# Patient Record
Sex: Female | Born: 1965 | Race: White | Hispanic: No | Marital: Married | Smoking: Never smoker
Health system: Southern US, Community
[De-identification: ages and names within clinical notes are randomized; demographics above are authoritative.]

## PROBLEM LIST (undated history)

## (undated) DIAGNOSIS — S2239XA Fracture of one rib, unspecified side, initial encounter for closed fracture: Secondary | ICD-10-CM

## (undated) DIAGNOSIS — Z803 Family history of malignant neoplasm of breast: Secondary | ICD-10-CM

## (undated) DIAGNOSIS — Z800 Family history of malignant neoplasm of digestive organs: Secondary | ICD-10-CM

## (undated) DIAGNOSIS — T7840XA Allergy, unspecified, initial encounter: Secondary | ICD-10-CM

## (undated) DIAGNOSIS — K219 Gastro-esophageal reflux disease without esophagitis: Secondary | ICD-10-CM

## (undated) DIAGNOSIS — N189 Chronic kidney disease, unspecified: Secondary | ICD-10-CM

## (undated) DIAGNOSIS — M8580 Other specified disorders of bone density and structure, unspecified site: Secondary | ICD-10-CM

## (undated) DIAGNOSIS — R112 Nausea with vomiting, unspecified: Secondary | ICD-10-CM

## (undated) DIAGNOSIS — Z923 Personal history of irradiation: Secondary | ICD-10-CM

## (undated) DIAGNOSIS — R000 Tachycardia, unspecified: Secondary | ICD-10-CM

## (undated) DIAGNOSIS — G709 Myoneural disorder, unspecified: Secondary | ICD-10-CM

## (undated) DIAGNOSIS — R51 Headache: Secondary | ICD-10-CM

## (undated) DIAGNOSIS — R0789 Other chest pain: Secondary | ICD-10-CM

## (undated) DIAGNOSIS — M1990 Unspecified osteoarthritis, unspecified site: Secondary | ICD-10-CM

## (undated) DIAGNOSIS — Z87442 Personal history of urinary calculi: Secondary | ICD-10-CM

## (undated) DIAGNOSIS — N62 Hypertrophy of breast: Secondary | ICD-10-CM

## (undated) DIAGNOSIS — Z9889 Other specified postprocedural states: Secondary | ICD-10-CM

## (undated) DIAGNOSIS — C50919 Malignant neoplasm of unspecified site of unspecified female breast: Secondary | ICD-10-CM

## (undated) DIAGNOSIS — R519 Headache, unspecified: Secondary | ICD-10-CM

## (undated) DIAGNOSIS — U071 COVID-19: Secondary | ICD-10-CM

## (undated) DIAGNOSIS — F40240 Claustrophobia: Secondary | ICD-10-CM

## (undated) DIAGNOSIS — C50812 Malignant neoplasm of overlapping sites of left female breast: Secondary | ICD-10-CM

## (undated) DIAGNOSIS — J40 Bronchitis, not specified as acute or chronic: Secondary | ICD-10-CM

## (undated) HISTORY — DX: Family history of malignant neoplasm of digestive organs: Z80.0

## (undated) HISTORY — DX: COVID-19: U07.1

## (undated) HISTORY — DX: Allergy, unspecified, initial encounter: T78.40XA

## (undated) HISTORY — DX: Malignant neoplasm of unspecified site of unspecified female breast: C50.919

## (undated) HISTORY — PX: UPPER GASTROINTESTINAL ENDOSCOPY: SHX188

## (undated) HISTORY — DX: Chronic kidney disease, unspecified: N18.9

## (undated) HISTORY — DX: Family history of malignant neoplasm of breast: Z80.3

## (undated) HISTORY — PX: POLYPECTOMY: SHX149

## (undated) HISTORY — DX: Malignant neoplasm of overlapping sites of left female breast: C50.812

## (undated) HISTORY — PX: REDUCTION MAMMAPLASTY: SUR839

## (undated) HISTORY — DX: Claustrophobia: F40.240

## (undated) HISTORY — DX: Myoneural disorder, unspecified: G70.9

## (undated) HISTORY — DX: Fracture of one rib, unspecified side, initial encounter for closed fracture: S22.39XA

## (undated) HISTORY — DX: Other chest pain: R07.89

## (undated) HISTORY — PX: WISDOM TOOTH EXTRACTION: SHX21

## (undated) HISTORY — DX: Other specified disorders of bone density and structure, unspecified site: M85.80

---

## 1996-11-24 HISTORY — PX: BACK SURGERY: SHX140

## 1996-11-24 HISTORY — PX: CHOLECYSTECTOMY: SHX55

## 2003-12-20 ENCOUNTER — Encounter: Admission: RE | Admit: 2003-12-20 | Discharge: 2004-03-19 | Payer: Self-pay | Admitting: Gastroenterology

## 2008-01-16 ENCOUNTER — Encounter: Admission: RE | Admit: 2008-01-16 | Discharge: 2008-01-16 | Payer: Self-pay | Admitting: Neurosurgery

## 2016-04-24 HISTORY — PX: LIPOMA EXCISION: SHX5283

## 2016-10-23 HISTORY — PX: COLONOSCOPY: SHX174

## 2016-11-24 HISTORY — PX: BREAST LUMPECTOMY: SHX2

## 2016-12-25 DIAGNOSIS — C50919 Malignant neoplasm of unspecified site of unspecified female breast: Secondary | ICD-10-CM | POA: Insufficient documentation

## 2016-12-25 HISTORY — DX: Malignant neoplasm of unspecified site of unspecified female breast: C50.919

## 2017-01-21 ENCOUNTER — Other Ambulatory Visit: Payer: PRIVATE HEALTH INSURANCE

## 2017-01-21 ENCOUNTER — Other Ambulatory Visit: Payer: Self-pay | Admitting: General Surgery

## 2017-01-21 ENCOUNTER — Ambulatory Visit (HOSPITAL_BASED_OUTPATIENT_CLINIC_OR_DEPARTMENT_OTHER): Payer: PRIVATE HEALTH INSURANCE | Admitting: Genetic Counselor

## 2017-01-21 DIAGNOSIS — Z800 Family history of malignant neoplasm of digestive organs: Secondary | ICD-10-CM | POA: Diagnosis not present

## 2017-01-21 DIAGNOSIS — Z803 Family history of malignant neoplasm of breast: Secondary | ICD-10-CM

## 2017-01-21 DIAGNOSIS — Z808 Family history of malignant neoplasm of other organs or systems: Secondary | ICD-10-CM

## 2017-01-21 DIAGNOSIS — Z806 Family history of leukemia: Secondary | ICD-10-CM

## 2017-01-21 DIAGNOSIS — Z315 Encounter for genetic counseling: Secondary | ICD-10-CM

## 2017-01-21 DIAGNOSIS — Z170 Estrogen receptor positive status [ER+]: Principal | ICD-10-CM

## 2017-01-21 DIAGNOSIS — D0512 Intraductal carcinoma in situ of left breast: Secondary | ICD-10-CM

## 2017-01-21 DIAGNOSIS — C50912 Malignant neoplasm of unspecified site of left female breast: Secondary | ICD-10-CM

## 2017-01-22 ENCOUNTER — Encounter: Payer: Self-pay | Admitting: Genetic Counselor

## 2017-01-22 DIAGNOSIS — Z803 Family history of malignant neoplasm of breast: Secondary | ICD-10-CM | POA: Insufficient documentation

## 2017-01-22 DIAGNOSIS — Z800 Family history of malignant neoplasm of digestive organs: Secondary | ICD-10-CM | POA: Insufficient documentation

## 2017-01-22 DIAGNOSIS — C50919 Malignant neoplasm of unspecified site of unspecified female breast: Secondary | ICD-10-CM | POA: Insufficient documentation

## 2017-01-22 NOTE — Progress Notes (Addendum)
REFERRING PROVIDER: Rolm Bookbinder, MD [place], [place] [address]  PRIMARY PROVIDER:  Myrlene Broker, MD  PRIMARY REASON FOR VISIT:  1. Malignant neoplasm of left breast in female, estrogen receptor positive, unspecified site of breast ([place])   2. Family history of breast cancer   3. Family history of colon cancer      HISTORY OF PRESENT ILLNESS:   [name], a 51 y.o. female, was seen for a [place] cancer genetics consultation at the request of Dr. Donne Hazel due to a personal and family history of cancer.  [name] presents to clinic today to discuss the possibility of a hereditary predisposition to cancer, genetic testing, and to further clarify her future cancer risks, as well as potential cancer risks for family members.   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 She was referred for genetic testing to help determine treatment decisions.     CANCER HISTORY:   No history exists.     HORMONAL RISK FACTORS:  Menarche was at age 63.  First live birth at age 9.  OCP use for approximately 13-14 years.  Ovaries intact: yes.  Hysterectomy: no.  Menopausal status: perimenopausal.  HRT use: 0 years. Colonoscopy: yes; 1-2 polyps. Mammogram within the last year: yes. Number of breast biopsies: 1. Up to date with pelvic exams:  yes. Any excessive radiation exposure in the past:  no  Past Medical History:  Diagnosis Date  . Breast cancer (HCC)    DCIS ER/PR+  . Family history of breast cancer   . Family history of colon cancer     History reviewed. No pertinent surgical history.  Social History   Social History  . Marital status: Married    Spouse name: N/A  . Number of children: N/A  . Years of education: N/A   Social History Main Topics  . Smoking status: Never Smoker  . Smokeless tobacco: Never Used  . Alcohol use No  . Drug use: No  . Sexual activity: Not  Asked   Other Topics Concern  . None   Social History Narrative  . None     FAMILY HISTORY:  We obtained a detailed, 4-generation family history.  Significant diagnoses are listed below: Family History  Problem Relation Age of Onset  . Breast cancer Mother 12  . Heart attack Father   . Lung cancer Maternal Aunt   . Congestive Heart Failure Maternal Uncle   . Skin cancer Paternal Aunt     possible melanoma  . Skin cancer Paternal Uncle     possible melanoma  . Colon cancer Maternal Grandmother     dx in her 86s  . Heart attack Maternal Grandfather   . Stroke Paternal Grandmother   . COPD Paternal Grandfather   . Colon cancer Brother 59    died at 10  . Leukemia Maternal Aunt   . Congestive Heart Failure Maternal Aunt   . Breast cancer Cousin     dx in he r30s  . Breast cancer Cousin     dx in her 88s-40s    The patient has a young son who is cancer free.  She had two brothers who are deceased.  One died at age 20 in a car accident and the other died at 20 from colon cancer.  Her father died of a heart attack at 52.  Her mother is alive at  40 and was diagnosed with breast cancer on the same day as the patient.  Her mother had four sisters and three brothers.  One sister died in early childhood, one died of lung cancer and one died of leukemia.  One brother died of heart disease and had a daughter who had breast cancer in her 75's.  Two brothers are alive, and one has a daughter who had breast cancer in her 30's-40's.  The patient's father had two brothers and two sisters.  One brother and one sister died of skin cancer, possibly melanoma.  There is no other reported family history of cancer on this side of the family.  Ms. Westendorf is unaware of previous family history of genetic testing for hereditary cancer risks. Patient's ancestors are of [place] descent. There is no reported Ashkenazi Jewish ancestry. There is no known consanguinity.  GENETIC COUNSELING ASSESSMENT:  Anne Lawrence is a 51 y.o. female with a personal and family history of breast cancer and family history of colon cancer which is somewhat suggestive of a hereditary cancer syndrome and predisposition to cancer. We, therefore, discussed and recommended the following at today's visit.   DISCUSSION: We discussed that about 5-10% of breast cancer is hereditary with most cases due to BRCA mutations.  Other genes associated with hereditary breast cancer syndromes that occur most commonly outside of BRCA, include ATM, CHEK2 and PALB2. We reviewed the characteristics, features and inheritance patterns of hereditary cancer syndromes. We also discussed genetic testing, including the appropriate family members to test, the process of testing, insurance coverage and turn-around-time for results. We discussed the implications of a negative, positive and/or variant of uncertain significant result. We recommended Ms. Hoes pursue genetic testing for the [facility] STAT breast cancer panel (9 genes).  The STAT 9  gene panel offered by [facility] and includes sequencing and rearrangement analysis for the following 9 genes: ATM, BRCA1, BRCA2, CHEK2, CDH1, PALB2, PTEN, STK11, and TP53.  If this is negative, we will reflex testing to the Common Hereditary Cancer gene panel and ensure that the melanoma genes are also part of this testing. The Hereditary Gene Panel offered by [facility] includes sequencing and/or deletion duplication testing of the following 43 genes: APC, ATM, AXIN2, BARD1, BMPR1A, BRCA1, BRCA2, BRIP1, CDH1, CDKN2A (p14ARF), CDKN2A (p16INK4a), CHEK2, DICER1, EPCAM (Deletion/duplication testing only), GREM1 (promoter region deletion/duplication testing only), KIT, MEN1, MLH1, MSH2, MSH6, MUTYH, NBN, NF1, PALB2, PDGFRA, PMS2, POLD1, POLE, PTEN, RAD50, RAD51C, RAD51D, SDHB, SDHC, SDHD, SMAD4, SMARCA4. STK11, TP53, TSC1, TSC2, and VHL.  The following gene was evaluated for sequence changes only: SDHA and HOXB13 c.251G>A  variant only. The Melanoma panel offered by [facility] includes sequencing and/or deletion duplication testing of the following 12 genes: BAP1, BRCA1, BRCA2, BRIP1, CDK4, CDKN2A (p14ARF), CDKN2A (p16INK4a), MC1R, POT1, PTEN, RB1, TERT, and TP53.  The following gene was evaluated for sequence changes only: MITF (c.952G>A, p.GLU318Lys variant only).   Based on Ms. Miers's personal and family history of cancer, she meets medical criteria for genetic testing. Despite that she meets criteria, she may still have an out of pocket cost. We discussed that if her out of pocket cost for testing is over $100, the laboratory will call and confirm whether she wants to proceed with testing.  If the out of pocket cost of testing is less than $100 she will be billed by the genetic testing laboratory.   PLAN: After considering the risks, benefits, and limitations, Ms. Vlcek  provided informed consent to pursue genetic testing  and the blood sample was sent to [facility] for analysis of the Common Hereditary Cancer Panel. Results should be available within approximately 2-3 weeks' time, at which point they will be disclosed by telephone to Ms. Widmann, as will any additional recommendations warranted by these results. Ms. Porcher will receive a summary of her genetic counseling visit and a copy of her results once available. This information will also be available in Epic. We encouraged Ms. Brar to remain in contact with cancer genetics annually so that we can continuously update the family history and inform her of any changes in cancer genetics and testing that may be of benefit for her family. Ms. Durley questions were answered to her satisfaction today. Our contact information was provided should additional questions or concerns arise.  Lastly, we encouraged Ms. Filsinger to remain in contact with cancer genetics annually so that we can continuously update the family history and inform her of any changes in cancer  genetics and testing that may be of benefit for this family.   Ms.  Mickle questions were answered to her satisfaction today. Our contact information was provided should additional questions or concerns arise. Thank you for the referral and allowing [place] to share in the care of your patient.   Rilya Longo P. Florene Glen, [employer], [facility] Certified Genetic Counselor [email or web address] phone: [phone]  The patient was seen for a total of 45 minutes in face-to-face genetic counseling.  This patient was discussed with Drs. Magrinat, Lindi Adie and/or Burr Medico who agrees with the above.    _______________________________________________________________________ For Office Staff:  Number of people involved in session: 2 Was an Intern/ student involved with case: no

## 2017-01-28 ENCOUNTER — Ambulatory Visit
Admission: RE | Admit: 2017-01-28 | Discharge: 2017-01-28 | Disposition: A | Discharge status: Home / Self Care | Payer: PRIVATE HEALTH INSURANCE | Source: Ambulatory Visit | Attending: General Surgery | Admitting: General Surgery

## 2017-01-28 DIAGNOSIS — E119 Type 2 diabetes mellitus without complications: Secondary | ICD-10-CM

## 2017-01-28 DIAGNOSIS — K589 Irritable bowel syndrome without diarrhea: Secondary | ICD-10-CM

## 2017-01-28 DIAGNOSIS — D0512 Intraductal carcinoma in situ of left breast: Secondary | ICD-10-CM

## 2017-01-28 HISTORY — DX: Type 2 diabetes mellitus without complications: E11.9

## 2017-01-28 HISTORY — DX: Irritable bowel syndrome, unspecified: K58.9

## 2017-01-28 HISTORY — DX: Intraductal carcinoma in situ of left breast: D05.12

## 2017-01-28 MED ORDER — GADOBENATE DIMEGLUMINE 529 MG/ML IV SOLN
19.0000 mL | Freq: Once | INTRAVENOUS | Status: AC | PRN
  Administered 2017-01-28: 19 mL via INTRAVENOUS

## 2017-01-30 ENCOUNTER — Other Ambulatory Visit: Payer: Self-pay | Admitting: General Surgery

## 2017-01-30 ENCOUNTER — Telehealth: Payer: Self-pay | Admitting: Genetic Counselor

## 2017-01-30 ENCOUNTER — Encounter: Payer: Self-pay | Admitting: Genetic Counselor

## 2017-01-30 DIAGNOSIS — R921 Mammographic calcification found on diagnostic imaging of breast: Secondary | ICD-10-CM

## 2017-01-30 DIAGNOSIS — Z1379 Encounter for other screening for genetic and chromosomal anomalies: Secondary | ICD-10-CM

## 2017-01-30 HISTORY — DX: Encounter for other screening for genetic and chromosomal anomalies: Z13.79

## 2017-01-30 NOTE — Telephone Encounter (Signed)
Revealed negative genetic testing.  Discussed that we do not know why she has breast cancer or why there is cancer in the family. It could be due to a different gene that we are not testing, or maybe our current technology may not be able to pick something up.  It will be important for her to keep in contact with genetics to keep up with whether additional testing may be needed. 

## 2017-02-03 ENCOUNTER — Ambulatory Visit: Payer: Self-pay | Admitting: Genetic Counselor

## 2017-02-03 DIAGNOSIS — Z1379 Encounter for other screening for genetic and chromosomal anomalies: Secondary | ICD-10-CM

## 2017-02-03 DIAGNOSIS — Z800 Family history of malignant neoplasm of digestive organs: Secondary | ICD-10-CM

## 2017-02-03 DIAGNOSIS — C50912 Malignant neoplasm of unspecified site of left female breast: Secondary | ICD-10-CM

## 2017-02-03 DIAGNOSIS — Z170 Estrogen receptor positive status [ER+]: Secondary | ICD-10-CM

## 2017-02-03 DIAGNOSIS — Z803 Family history of malignant neoplasm of breast: Secondary | ICD-10-CM

## 2017-02-03 NOTE — Progress Notes (Signed)
HPI: Anne Lawrence was previously seen in the [place] clinic due to a personal and family history of cancer and concerns regarding a hereditary predisposition to cancer. Please refer to our prior cancer genetics clinic note for more information regarding Anne Lawrence's medical, social and family histories, and our assessment and recommendations, at the time. Anne Lawrence recent genetic test results were disclosed to her, as were recommendations warranted by these results. These results and recommendations are discussed in more detail below.  CANCER HISTORY:    Breast cancer [facility])    Initial Diagnosis    Breast cancer ([place])     01/30/2017 Genetic Testing    Negative genetic testing on the STAT Breast cancer panel and the reflexed common cancer panel.  The Hereditary Gene Panel offered by [facility] includes sequencing and/or deletion duplication testing of the following 43 genes: APC, ATM, AXIN2, BARD1, BMPR1A, BRCA1, BRCA2, BRIP1, CDH1, CDKN2A (p14ARF), CDKN2A (p16INK4a), CHEK2, DICER1, EPCAM (Deletion/duplication testing only), GREM1 (promoter region deletion/duplication testing only), KIT, MEN1, MLH1, MSH2, MSH6, MUTYH, NBN, NF1, PALB2, PDGFRA, PMS2, POLD1, POLE, PTEN, RAD50, RAD51C, RAD51D, SDHB, SDHC, SDHD, SMAD4, SMARCA4. STK11, TP53, TSC1, TSC2, and VHL.  The following gene was evaluated for sequence changes only: SDHA and HOXB13 c.251G>A variant only.  The report date is January 30, 2017.        FAMILY HISTORY:  We obtained a detailed, 4-generation family history.  Significant diagnoses are listed below: Family History  Problem Relation Age of Onset  . Breast cancer Mother 39  . Heart attack Father   . Lung cancer Maternal Aunt   . Congestive Heart Failure Maternal Uncle   . Skin cancer Paternal Aunt     possible melanoma  . Skin cancer Paternal Uncle     possible melanoma  . Colon cancer Maternal Grandmother     dx in her 80s  . Heart attack Maternal Grandfather   .  Stroke Paternal Grandmother   . COPD Paternal Grandfather   . Colon cancer Brother 61    died at 87  . Leukemia Maternal Aunt   . Congestive Heart Failure Maternal Aunt   . Breast cancer Cousin     dx in he r30s  . Breast cancer Cousin     dx in her 94s-40s    The patient has a young son who is cancer free.  She had two brothers who are deceased.  One died at age 7 in a car accident and the other died at 51 from colon cancer.  Her father died of a heart attack at 2.  Her mother is alive at 58 and was diagnosed with breast cancer on the same day as the patient.  Her mother had four sisters and three brothers.  One sister died in early childhood, one died of lung cancer and one died of leukemia.  One brother died of heart disease and had a daughter who had breast cancer in her 86's.  Two brothers are alive, and one has a daughter who had breast cancer in her 30's-40's.  The patient's father had two brothers and two sisters.  One brother and one sister died of skin cancer, possibly melanoma.  There is no other reported family history of cancer on this side of the family.  Anne Lawrence is unaware of previous family history of genetic testing for hereditary cancer risks. Patient's ancestors are of [place] descent. There is no reported Ashkenazi Jewish ancestry. There is no known consanguinity.  GENETIC TEST  RESULTS: Genetic testing reported out on January 24, 2017 through the Common Hereditary cancer panel found no deleterious mutations.  The Hereditary Gene Panel offered by [facility] includes sequencing and/or deletion duplication testing of the following 43 genes: APC, ATM, AXIN2, BARD1, BMPR1A, BRCA1, BRCA2, BRIP1, CDH1, CDKN2A (p14ARF), CDKN2A (p16INK4a), CHEK2, DICER1, EPCAM (Deletion/duplication testing only), GREM1 (promoter region deletion/duplication testing only), KIT, MEN1, MLH1, MSH2, MSH6, MUTYH, NBN, NF1, PALB2, PDGFRA, PMS2, POLD1, POLE, PTEN, RAD50, RAD51C, RAD51D, SDHB, SDHC, SDHD,  SMAD4, SMARCA4. STK11, TP53, TSC1, TSC2, and VHL.  The following gene was evaluated for sequence changes only: SDHA and HOXB13 c.251G>A variant only. The test report has been scanned into EPIC and is located under the Molecular Pathology section of the Results Review tab.   We discussed with Anne Lawrence that since the current genetic testing is not perfect, it is possible there may be a gene mutation in one of these genes that current testing cannot detect, but that chance is small. We also discussed, that it is possible that another gene that has not yet been discovered, or that we have not yet tested, is responsible for the cancer diagnoses in the family, and it is, therefore, important to remain in touch with cancer genetics in the future so that we can continue to offer Anne Lawrence the most up to date genetic testing.   CANCER SCREENING RECOMMENDATIONS:  This result is reassuring and indicates that Anne Lawrence likely does not have an increased risk for a future cancer due to a mutation in one of these genes. This normal test also suggests that Anne Lawrence's cancer was most likely not due to an inherited predisposition associated with one of these genes.  Most cancers happen by chance and this negative test suggests that her cancer falls into this category.  We, therefore, recommended she continue to follow the cancer management and screening guidelines provided by her oncology and primary healthcare provider.   RECOMMENDATIONS FOR FAMILY MEMBERS: Women in this family might be at some increased risk of developing cancer, over the general population risk, simply due to the family history of cancer. We recommended women in this family have a yearly mammogram beginning at age 22, or 56 years younger than the earliest onset of cancer, an annual clinical breast exam, and perform monthly breast self-exams. Women in this family should also have a gynecological exam as recommended by their primary provider. All  family members should have a colonoscopy by age 36.  FOLLOW-UP: Lastly, we discussed with Anne Lawrence that cancer genetics is a rapidly advancing field and it is possible that new genetic tests will be appropriate for her and/or her family members in the future. We encouraged her to remain in contact with cancer genetics on an annual basis so we can update her personal and family histories and let her know of advances in cancer genetics that may benefit this family.   Our contact number was provided. Anne Lawrence questions were answered to her satisfaction, and she knows she is welcome to call us at anytime with additional questions or concerns.   Roma Kayser, MS, [facility] Certified Genetic Counselor [email or web address]

## 2017-02-06 ENCOUNTER — Ambulatory Visit
Admission: RE | Admit: 2017-02-06 | Discharge: 2017-02-06 | Disposition: A | Discharge status: Home / Self Care | Payer: PRIVATE HEALTH INSURANCE | Source: Ambulatory Visit | Attending: General Surgery | Admitting: General Surgery

## 2017-02-06 ENCOUNTER — Other Ambulatory Visit: Payer: Self-pay | Admitting: General Surgery

## 2017-02-06 DIAGNOSIS — R921 Mammographic calcification found on diagnostic imaging of breast: Secondary | ICD-10-CM

## 2017-02-13 ENCOUNTER — Other Ambulatory Visit: Payer: Self-pay | Admitting: General Surgery

## 2017-02-13 DIAGNOSIS — D0512 Intraductal carcinoma in situ of left breast: Secondary | ICD-10-CM

## 2017-02-19 ENCOUNTER — Other Ambulatory Visit: Payer: Self-pay | Admitting: General Surgery

## 2017-02-19 DIAGNOSIS — D0512 Intraductal carcinoma in situ of left breast: Secondary | ICD-10-CM

## 2017-03-05 ENCOUNTER — Encounter (HOSPITAL_COMMUNITY): Payer: Self-pay | Admitting: *Deleted

## 2017-03-05 ENCOUNTER — Encounter (HOSPITAL_COMMUNITY)
Admission: RE | Admit: 2017-03-05 | Discharge: 2017-03-05 | Disposition: A | Discharge status: Home / Self Care | Payer: PRIVATE HEALTH INSURANCE | Source: Ambulatory Visit | Attending: General Surgery | Admitting: General Surgery

## 2017-03-05 DIAGNOSIS — Z01812 Encounter for preprocedural laboratory examination: Secondary | ICD-10-CM | POA: Diagnosis not present

## 2017-03-05 DIAGNOSIS — C50912 Malignant neoplasm of unspecified site of left female breast: Secondary | ICD-10-CM | POA: Insufficient documentation

## 2017-03-05 HISTORY — DX: Other specified postprocedural states: Z98.890

## 2017-03-05 HISTORY — DX: Tachycardia, unspecified: R00.0

## 2017-03-05 HISTORY — DX: Nausea with vomiting, unspecified: R11.2

## 2017-03-05 HISTORY — DX: Unspecified osteoarthritis, unspecified site: M19.90

## 2017-03-05 HISTORY — DX: Headache, unspecified: R51.9

## 2017-03-05 HISTORY — DX: Headache: R51

## 2017-03-05 HISTORY — DX: Personal history of urinary calculi: Z87.442

## 2017-03-05 HISTORY — DX: Gastro-esophageal reflux disease without esophagitis: K21.9

## 2017-03-05 LAB — CBC
HCT: 38.9 % (ref 36.0–46.0)
HEMOGLOBIN: 13.1 g/dL (ref 12.0–15.0)
MCH: 27.8 pg (ref 26.0–34.0)
MCHC: 33.7 g/dL (ref 30.0–36.0)
MCV: 82.6 fL (ref 78.0–100.0)
PLATELETS: 206 10*3/uL (ref 150–400)
RBC: 4.71 MIL/uL (ref 3.87–5.11)
RDW: 13.1 % (ref 11.5–15.5)
WBC: 10.7 10*3/uL — ABNORMAL HIGH (ref 4.0–10.5)

## 2017-03-05 LAB — BASIC METABOLIC PANEL
ANION GAP: 8 (ref 5–15)
BUN: 13 mg/dL (ref 6–20)
CALCIUM: 8.9 mg/dL (ref 8.9–10.3)
CO2: 23 mmol/L (ref 22–32)
Chloride: 109 mmol/L (ref 101–111)
Creatinine, Ser: 0.75 mg/dL (ref 0.44–1.00)
GFR calc Af Amer: >60 mL/min (ref >60)
GLUCOSE: 130 mg/dL — AB (ref 65–99)
Potassium: 3.6 mmol/L (ref 3.5–5.1)
Sodium: 140 mmol/L (ref 135–145)

## 2017-03-05 LAB — HCG, SERUM, QUALITATIVE: PREG SERUM: NEGATIVE

## 2017-03-05 NOTE — Progress Notes (Signed)
PCP:Dr. Janace Litten white [facility] in [place]--requested ekg tracing, notes, holter monitor results.

## 2017-03-05 NOTE — Pre-Procedure Instructions (Addendum)
Lamari Youngers  03/05/2017      [address], [place], [name] [address] Phone: [phone] Fax: [phone]    Your procedure is scheduled on Thurs. April 19  Report to [facility] Admitting at 5:30 A.M.  Call this number if you have problems the morning of surgery:  [phone]   Remember:  Do not eat food or drink liquids after midnight on Wed. April 18   Take these medicines the morning of surgery with A SIP OF WATER : nexium if needed, eye drops if needed              1 week prior to surgery stop: advil, motrin, ibuprofen, aleve, BC Powders, Goody's, vitamins/herbal medicines               The day of surgery 3:30 A.M. Drink Boost Breeze    Do not wear jewelry, make-up or nail polish.  Do not wear lotions, powders, or perfumes, or deoderant.  Do not shave 48 hours prior to surgery.  Men may shave face and neck.  Do not bring valuables to the hospital.  [facility] is not responsible for any belongings or valuables.  Contacts, dentures or bridgework may not be worn into surgery.  Leave your suitcase in the car.  After surgery it may be brought to your room.  For patients admitted to the hospital, discharge time will be determined by your treatment team.  Patients discharged the day of surgery will not be allowed to drive home.   Name and phone number of your drive Special instructions:  [facility]- Preparing For Surgery  Before surgery, you can play an important role. Because skin is not sterile, your skin needs to be as free of germs as possible. You can reduce the number of germs on your skin by washing with CHG (chlorahexidine gluconate) Soap before surgery.  CHG is an antiseptic cleaner which kills germs and bonds with the skin to continue killing germs even after washing.  Please do not use if you have an allergy to CHG or antibacterial soaps. If your skin becomes reddened/irritated stop using  the CHG.  Do not shave (including legs and underarms) for at least 48 hours prior to first CHG shower. It is OK to shave your face.  Please follow these instructions carefully.   1. Shower the NIGHT BEFORE SURGERY and the MORNING OF SURGERY with CHG.   2. If you chose to wash your hair, wash your hair first as usual with your normal shampoo.  3. After you shampoo, rinse your hair and body thoroughly to remove the shampoo.  4. Use CHG as you would any other liquid soap. You can apply CHG directly to the skin and wash gently with a scrungie or a clean washcloth.   5. Apply the CHG Soap to your body ONLY FROM THE NECK DOWN.  Do not use on open wounds or open sores. Avoid contact with your eyes, ears, mouth and genitals (private parts). Wash genitals (private parts) with your normal soap.  6. Wash thoroughly, paying special attention to the area where your surgery will be performed.  7. Thoroughly rinse your body with warm water from the neck down.  8. DO NOT shower/wash with your normal soap after using and rinsing off the CHG Soap.  9. Pat yourself dry with a CLEAN TOWEL.   10. Wear CLEAN PAJAMAS   11. Place  CLEAN SHEETS on your bed the night of your first shower and DO NOT SLEEP WITH PETS.    Day of Surgery: Do not apply any deodorants/lotions. Please wear clean clothes to the hospital/surgery center.      Please read over the following fact sheets that you were given. Coughing and Deep Breathing and Surgical Site Infection Prevention

## 2017-03-10 ENCOUNTER — Ambulatory Visit
Admission: RE | Admit: 2017-03-10 | Discharge: 2017-03-10 | Disposition: A | Discharge status: Home / Self Care | Payer: PRIVATE HEALTH INSURANCE | Source: Ambulatory Visit | Attending: General Surgery | Admitting: General Surgery

## 2017-03-10 DIAGNOSIS — D0512 Intraductal carcinoma in situ of left breast: Secondary | ICD-10-CM

## 2017-03-11 NOTE — Anesthesia Preprocedure Evaluation (Addendum)
Anesthesia Evaluation  Patient identified by MRN, date of birth, ID band Patient awake    Reviewed: Allergy & Precautions, H&P , NPO status , Patient's Chart, lab work & pertinent test results, reviewed documented beta blocker date and time   History of Anesthesia Complications (+) PONV  Airway Mallampati: II  TM Distance: >3 FB Neck ROM: Full    Dental no notable dental hx. (+) Teeth Intact, Dental Advisory Given   Pulmonary neg pulmonary ROS,    Pulmonary exam normal breath sounds clear to auscultation       Cardiovascular Exercise Tolerance: Good negative cardio ROS   Rhythm:Regular Rate:Normal     Neuro/Psych  Headaches, negative psych ROS   GI/Hepatic Neg liver ROS, GERD  Medicated and Controlled,  Endo/Other  Morbid obesity  Renal/GU negative Renal ROS  negative genitourinary   Musculoskeletal  (+) Arthritis ,   Abdominal   Peds  Hematology negative hematology ROS (+)   Anesthesia Other Findings   Reproductive/Obstetrics negative OB ROS                            Anesthesia Physical Anesthesia Plan  ASA: III  Anesthesia Plan: General   Post-op Pain Management:  Regional for Post-op pain   Induction: Intravenous  Airway Management Planned: Oral ETT  Additional Equipment:   Intra-op Plan:   Post-operative Plan: Extubation in OR  Informed Consent: I have reviewed the patients History and Physical, chart, labs and discussed the procedure including the risks, benefits and alternatives for the proposed anesthesia with the patient or authorized representative who has indicated his/her understanding and acceptance.   Dental advisory given  Plan Discussed with: CRNA, Anesthesiologist and Surgeon  Anesthesia Plan Comments:        Anesthesia Quick Evaluation

## 2017-03-12 ENCOUNTER — Ambulatory Visit (HOSPITAL_COMMUNITY): Payer: PRIVATE HEALTH INSURANCE | Admitting: Anesthesiology

## 2017-03-12 ENCOUNTER — Encounter (HOSPITAL_COMMUNITY)
Admission: RE | Disposition: A | Discharge status: Home / Self Care | Payer: Self-pay | Source: Ambulatory Visit | Attending: General Surgery

## 2017-03-12 ENCOUNTER — Ambulatory Visit (HOSPITAL_COMMUNITY)
Admission: RE | Admit: 2017-03-12 | Discharge: 2017-03-12 | Disposition: A | Discharge status: Home / Self Care | Payer: PRIVATE HEALTH INSURANCE | Source: Ambulatory Visit | Attending: General Surgery | Admitting: General Surgery

## 2017-03-12 ENCOUNTER — Ambulatory Visit
Admission: RE | Admit: 2017-03-12 | Discharge: 2017-03-12 | Disposition: A | Discharge status: Home / Self Care | Payer: PRIVATE HEALTH INSURANCE | Source: Ambulatory Visit | Attending: General Surgery | Admitting: General Surgery

## 2017-03-12 ENCOUNTER — Encounter (HOSPITAL_COMMUNITY): Payer: Self-pay | Admitting: *Deleted

## 2017-03-12 ENCOUNTER — Other Ambulatory Visit: Payer: Self-pay

## 2017-03-12 ENCOUNTER — Ambulatory Visit (HOSPITAL_BASED_OUTPATIENT_CLINIC_OR_DEPARTMENT_OTHER)
Admission: RE | Admit: 2017-03-12 | Discharge: 2017-03-12 | Disposition: A | Discharge status: Home / Self Care | Payer: PRIVATE HEALTH INSURANCE | Source: Ambulatory Visit | Attending: General Surgery | Admitting: General Surgery

## 2017-03-12 DIAGNOSIS — K219 Gastro-esophageal reflux disease without esophagitis: Secondary | ICD-10-CM | POA: Diagnosis not present

## 2017-03-12 DIAGNOSIS — D0512 Intraductal carcinoma in situ of left breast: Secondary | ICD-10-CM

## 2017-03-12 DIAGNOSIS — K5090 Crohn's disease, unspecified, without complications: Secondary | ICD-10-CM | POA: Insufficient documentation

## 2017-03-12 DIAGNOSIS — Z6841 Body Mass Index (BMI) 40.0 and over, adult: Secondary | ICD-10-CM | POA: Insufficient documentation

## 2017-03-12 DIAGNOSIS — Z79899 Other long term (current) drug therapy: Secondary | ICD-10-CM | POA: Insufficient documentation

## 2017-03-12 DIAGNOSIS — Z803 Family history of malignant neoplasm of breast: Secondary | ICD-10-CM | POA: Insufficient documentation

## 2017-03-12 DIAGNOSIS — C50512 Malignant neoplasm of lower-outer quadrant of left female breast: Secondary | ICD-10-CM | POA: Diagnosis present

## 2017-03-12 HISTORY — PX: BREAST LUMPECTOMY WITH RADIOACTIVE SEED AND SENTINEL LYMPH NODE BIOPSY: SHX6550

## 2017-03-12 SURGERY — BREAST LUMPECTOMY WITH RADIOACTIVE SEED AND SENTINEL LYMPH NODE BIOPSY
Anesthesia: General | Site: Breast | Laterality: Left

## 2017-03-12 MED ORDER — FENTANYL CITRATE (PF) 100 MCG/2ML IJ SOLN
INTRAMUSCULAR | Status: DC | PRN
  Administered 2017-03-12: 100 ug via INTRAVENOUS
  Administered 2017-03-12: 50 ug via INTRAVENOUS

## 2017-03-12 MED ORDER — ROCURONIUM BROMIDE 100 MG/10ML IV SOLN
INTRAVENOUS | Status: DC | PRN
  Administered 2017-03-12: 50 mg via INTRAVENOUS

## 2017-03-12 MED ORDER — DEXAMETHASONE SODIUM PHOSPHATE 10 MG/ML IJ SOLN
INTRAMUSCULAR | Status: AC
  Filled 2017-03-12: qty 1

## 2017-03-12 MED ORDER — CELECOXIB 200 MG PO CAPS
200.0000 mg | ORAL_CAPSULE | ORAL | Status: AC
  Administered 2017-03-12: 200 mg via ORAL
  Filled 2017-03-12: qty 1

## 2017-03-12 MED ORDER — PHENYLEPHRINE HCL 10 MG/ML IJ SOLN
INTRAVENOUS | Status: DC | PRN
  Administered 2017-03-12: 40 ug/min via INTRAVENOUS

## 2017-03-12 MED ORDER — DEXAMETHASONE SODIUM PHOSPHATE 10 MG/ML IJ SOLN
INTRAMUSCULAR | Status: DC | PRN
  Administered 2017-03-12: 10 mg via INTRAVENOUS

## 2017-03-12 MED ORDER — LACTATED RINGERS IV SOLN
INTRAVENOUS | Status: DC | PRN
  Administered 2017-03-12: 07:00:00 via INTRAVENOUS

## 2017-03-12 MED ORDER — METHYLENE BLUE 0.5 % INJ SOLN
INTRAVENOUS | Status: AC
  Filled 2017-03-12: qty 10

## 2017-03-12 MED ORDER — CHLORHEXIDINE GLUCONATE CLOTH 2 % EX PADS: 6.0000 | MEDICATED_PAD | Freq: Once | CUTANEOUS | Status: DC

## 2017-03-12 MED ORDER — CEFAZOLIN SODIUM-DEXTROSE 2-4 GM/100ML-% IV SOLN
2.0000 g | INTRAVENOUS | Status: AC
  Administered 2017-03-12: 2 g via INTRAVENOUS
  Filled 2017-03-12: qty 100

## 2017-03-12 MED ORDER — BUPIVACAINE HCL (PF) 0.25 % IJ SOLN
INTRAMUSCULAR | Status: AC
  Filled 2017-03-12: qty 30

## 2017-03-12 MED ORDER — SUCCINYLCHOLINE CHLORIDE 200 MG/10ML IV SOSY
PREFILLED_SYRINGE | INTRAVENOUS | Status: AC
  Filled 2017-03-12: qty 10

## 2017-03-12 MED ORDER — SODIUM CHLORIDE 0.9 % IJ SOLN
INTRAMUSCULAR | Status: AC
  Filled 2017-03-12: qty 10

## 2017-03-12 MED ORDER — TECHNETIUM TC 99M SULFUR COLLOID FILTERED
1.0000 | Freq: Once | INTRAVENOUS | Status: AC | PRN
  Administered 2017-03-12: 1 via INTRADERMAL

## 2017-03-12 MED ORDER — ROCURONIUM BROMIDE 10 MG/ML (PF) SYRINGE
PREFILLED_SYRINGE | INTRAVENOUS | Status: AC
  Filled 2017-03-12: qty 10

## 2017-03-12 MED ORDER — EPHEDRINE 5 MG/ML INJ
INTRAVENOUS | Status: AC
  Filled 2017-03-12: qty 10

## 2017-03-12 MED ORDER — GABAPENTIN 300 MG PO CAPS
300.0000 mg | ORAL_CAPSULE | ORAL | Status: AC
  Administered 2017-03-12: 300 mg via ORAL
  Filled 2017-03-12: qty 1

## 2017-03-12 MED ORDER — MIDAZOLAM HCL 5 MG/5ML IJ SOLN
INTRAMUSCULAR | Status: DC | PRN
  Administered 2017-03-12: 2 mg via INTRAVENOUS

## 2017-03-12 MED ORDER — OXYCODONE-ACETAMINOPHEN 10-325 MG PO TABS: 1.0000 | ORAL_TABLET | Freq: Four times a day (QID) | ORAL | 0 refills | Status: AC | PRN

## 2017-03-12 MED ORDER — ONDANSETRON HCL 4 MG/2ML IJ SOLN
INTRAMUSCULAR | Status: DC | PRN
  Administered 2017-03-12: 4 mg via INTRAVENOUS

## 2017-03-12 MED ORDER — 0.9 % SODIUM CHLORIDE (POUR BTL) OPTIME
TOPICAL | Status: DC | PRN
  Administered 2017-03-12: 1000 mL

## 2017-03-12 MED ORDER — HYDROMORPHONE HCL 1 MG/ML IJ SOLN: 0.2500 mg | INTRAMUSCULAR | Status: DC | PRN

## 2017-03-12 MED ORDER — BUPIVACAINE-EPINEPHRINE (PF) 0.5% -1:200000 IJ SOLN
INTRAMUSCULAR | Status: DC | PRN
Start: 2017-03-12 — End: 2017-03-12
  Administered 2017-03-12: 30 mL

## 2017-03-12 MED ORDER — FENTANYL CITRATE (PF) 250 MCG/5ML IJ SOLN
INTRAMUSCULAR | Status: AC
  Filled 2017-03-12: qty 5

## 2017-03-12 MED ORDER — LIDOCAINE HCL (CARDIAC) 20 MG/ML IV SOLN
INTRAVENOUS | Status: DC | PRN
  Administered 2017-03-12: 60 mg via INTRAVENOUS

## 2017-03-12 MED ORDER — PROPOFOL 10 MG/ML IV BOLUS
INTRAVENOUS | Status: DC | PRN
  Administered 2017-03-12: 200 mg via INTRAVENOUS

## 2017-03-12 MED ORDER — MIDAZOLAM HCL 2 MG/2ML IJ SOLN
INTRAMUSCULAR | Status: AC
  Filled 2017-03-12: qty 2

## 2017-03-12 MED ORDER — ACETAMINOPHEN 500 MG PO TABS
1000.0000 mg | ORAL_TABLET | ORAL | Status: AC
  Administered 2017-03-12: 1000 mg via ORAL
  Filled 2017-03-12: qty 2

## 2017-03-12 MED ORDER — PROPOFOL 10 MG/ML IV BOLUS
INTRAVENOUS | Status: AC
  Filled 2017-03-12: qty 20

## 2017-03-12 MED ORDER — ONDANSETRON HCL 4 MG/2ML IJ SOLN
INTRAMUSCULAR | Status: AC
  Filled 2017-03-12: qty 2

## 2017-03-12 MED ORDER — SUGAMMADEX SODIUM 200 MG/2ML IV SOLN
INTRAVENOUS | Status: DC | PRN
  Administered 2017-03-12: 200 mg via INTRAVENOUS

## 2017-03-12 SURGICAL SUPPLY — 57 items
ADH SKN CLS APL DERMABOND .7 (GAUZE/BANDAGES/DRESSINGS) ×2
APPLIER CLIP 9.375 MED OPEN (MISCELLANEOUS) ×3
APR CLP MED 9.3 20 MLT OPN (MISCELLANEOUS) ×1
BINDER BREAST LRG (GAUZE/BANDAGES/DRESSINGS) IMPLANT
BINDER BREAST XLRG (GAUZE/BANDAGES/DRESSINGS) IMPLANT
BINDER BREAST XXLRG (GAUZE/BANDAGES/DRESSINGS) ×2 IMPLANT
BLADE SURG 15 STRL LF DISP TIS (BLADE) ×1 IMPLANT
BLADE SURG 15 STRL SS (BLADE) ×3
CANISTER SUCT 3000ML PPV (MISCELLANEOUS) ×3 IMPLANT
CHLORAPREP W/TINT 26ML (MISCELLANEOUS) ×3 IMPLANT
CLIP APPLIE 9.375 MED OPEN (MISCELLANEOUS) ×1 IMPLANT
CLOSURE WOUND 1/2 X4 (GAUZE/BANDAGES/DRESSINGS) ×1
COVER PROBE W GEL 5X96 (DRAPES) ×3 IMPLANT
COVER SURGICAL LIGHT HANDLE (MISCELLANEOUS) ×3 IMPLANT
DERMABOND ADVANCED (GAUZE/BANDAGES/DRESSINGS) ×4
DERMABOND ADVANCED .7 DNX12 (GAUZE/BANDAGES/DRESSINGS) ×1 IMPLANT
DEVICE DUBIN SPECIMEN MAMMOGRA (MISCELLANEOUS) ×3 IMPLANT
DRAPE CHEST BREAST 15X10 FENES (DRAPES) ×3 IMPLANT
DRAPE UTILITY XL STRL (DRAPES) ×3 IMPLANT
ELECT COATED BLADE 2.86 ST (ELECTRODE) ×3 IMPLANT
ELECT REM PT RETURN 9FT ADLT (ELECTROSURGICAL) ×3
ELECTRODE REM PT RTRN 9FT ADLT (ELECTROSURGICAL) ×1 IMPLANT
GLOVE BIO SURGEON STRL SZ7 (GLOVE) ×3 IMPLANT
GLOVE BIOGEL PI IND STRL 7.5 (GLOVE) ×1 IMPLANT
GLOVE BIOGEL PI INDICATOR 7.5 (GLOVE) ×2
GOWN STRL REUS W/ TWL LRG LVL3 (GOWN DISPOSABLE) ×2 IMPLANT
GOWN STRL REUS W/TWL LRG LVL3 (GOWN DISPOSABLE) ×6
ILLUMINATOR WAVEGUIDE N/F (MISCELLANEOUS) ×2 IMPLANT
KIT BASIN OR (CUSTOM PROCEDURE TRAY) ×3 IMPLANT
KIT MARKER MARGIN INK (KITS) ×3 IMPLANT
MARKER SKIN DUAL TIP RULER LAB (MISCELLANEOUS) ×3 IMPLANT
NDL FILTER BLUNT 18X1 1/2 (NEEDLE) IMPLANT
NDL HYPO 25X1 1.5 SAFETY (NEEDLE) ×1 IMPLANT
NDL SAFETY ECLIPSE 18X1.5 (NEEDLE) IMPLANT
NEEDLE FILTER BLUNT 18X 1/2SAF (NEEDLE)
NEEDLE FILTER BLUNT 18X1 1/2 (NEEDLE) IMPLANT
NEEDLE HYPO 18GX1.5 SHARP (NEEDLE)
NEEDLE HYPO 25X1 1.5 SAFETY (NEEDLE) ×3 IMPLANT
NS IRRIG 1000ML POUR BTL (IV SOLUTION) ×3 IMPLANT
PACK SURGICAL SETUP 50X90 (CUSTOM PROCEDURE TRAY) ×3 IMPLANT
PENCIL BUTTON HOLSTER BLD 10FT (ELECTRODE) ×3 IMPLANT
SPONGE LAP 18X18 X RAY DECT (DISPOSABLE) ×3 IMPLANT
STRIP CLOSURE SKIN 1/2X4 (GAUZE/BANDAGES/DRESSINGS) ×2 IMPLANT
SUT MNCRL AB 4-0 PS2 18 (SUTURE) ×6 IMPLANT
SUT SILK 2 0 SH (SUTURE) IMPLANT
SUT VIC AB 2-0 SH 27 (SUTURE) ×9
SUT VIC AB 2-0 SH 27X BRD (SUTURE) IMPLANT
SUT VIC AB 2-0 SH 27XBRD (SUTURE) ×2 IMPLANT
SUT VIC AB 3-0 SH 27 (SUTURE) ×6
SUT VIC AB 3-0 SH 27X BRD (SUTURE) ×2 IMPLANT
SYR BULB 3OZ (MISCELLANEOUS) ×3 IMPLANT
SYR CONTROL 10ML LL (SYRINGE) ×3 IMPLANT
TOWEL OR 17X24 6PK STRL BLUE (TOWEL DISPOSABLE) ×3 IMPLANT
TOWEL OR 17X26 10 PK STRL BLUE (TOWEL DISPOSABLE) ×1 IMPLANT
TUBE CONNECTING 12'X1/4 (SUCTIONS) ×1
TUBE CONNECTING 12X1/4 (SUCTIONS) ×2 IMPLANT
YANKAUER SUCT BULB TIP NO VENT (SUCTIONS) ×3 IMPLANT

## 2017-03-12 NOTE — Anesthesia Procedure Notes (Signed)
Anesthesia Regional Block: Pectoralis block   Pre-Anesthetic Checklist: ,, timeout performed, Correct Patient, Correct Site, Correct Laterality, Correct Procedure, Correct Position, site marked, Risks and benefits discussed, pre-op evaluation,  At surgeon's request and post-op pain management  Laterality: Left  Prep: Maximum Sterile Barrier Precautions used, chloraprep       Needles:  Injection technique: Single-shot  Needle Type: Echogenic Stimulator Needle     Needle Length: 9cm  Needle Gauge: 21     Additional Needles:   Procedures: ultrasound guided,,,,,,,,  Narrative:  Start time: 03/12/2017 7:12 AM End time: 03/12/2017 7:22 AM Injection made incrementally with aspirations every 5 mL. Anesthesiologist: Roderic Palau  Additional Notes: 2% Lidocaine skin wheel.

## 2017-03-12 NOTE — Progress Notes (Signed)
Patient ID: Anne Lawrence, female   DOB: Apr 11, 1966, 51 y.o.   MRN: [id number] I reviewed the [place] today and prescribed percocet for postop pain control

## 2017-03-12 NOTE — Anesthesia Procedure Notes (Signed)
Procedure Name: Intubation Date/Time: 03/12/2017 7:45 AM Performed by: Carney Living Pre-anesthesia Checklist: Patient identified, Emergency Drugs available, Suction available, Patient being monitored and Timeout performed Patient Re-evaluated:Patient Re-evaluated prior to inductionOxygen Delivery Method: Circle system utilized Preoxygenation: Pre-oxygenation with 100% oxygen Intubation Type: IV induction Ventilation: Mask ventilation without difficulty Laryngoscope Size: Mac and 4 Grade View: Grade II Tube type: Oral Tube size: 7.5 mm Number of attempts: 1 Airway Equipment and Method: Stylet Placement Confirmation: ETT inserted through vocal cords under direct vision,  positive ETCO2 and breath sounds checked- equal and bilateral Secured at: 21 cm Tube secured with: Tape Dental Injury: Teeth and Oropharynx as per pre-operative assessment

## 2017-03-12 NOTE — Progress Notes (Signed)
Report given to maria rn as caregiver 

## 2017-03-12 NOTE — Interval H&P Note (Signed)
History and Physical Interval Note:  03/12/2017 7:03 AM  Anne Lawrence  has presented today for surgery, with the diagnosis of LEFT BREAST DCIS  The various methods of treatment have been discussed with the patient and family. After consideration of risks, benefits and other options for treatment, the patient has consented to  Procedure(s): LEFT BREAST LUMPECTOMY WITH BRACKETED  RADIOACTIVE SEED AND SENTINEL LYMPH NODE BIOPSY (Left) as a surgical intervention .  The patient's history has been reviewed, patient examined, no change in status, stable for surgery.  I have reviewed the patient's chart and labs.  Questions were answered to the patient's satisfaction.     Kineta Fudala

## 2017-03-12 NOTE — Op Note (Signed)
Preoperative diagnosis: Leftbreast cancer, clinical stage I Postoperative diagnosis: same as above Procedure: 1. Left breast seed bracketed guided lumpectomy 2. Leftdeep axillary sentinel node biopsy Surgeon Dr Serita Grammes Anes general  EBL: minimal Comps none Specimen:  1. Leftbreast marked with paint containing seeds and clips 2. Leftaxillary sentinel nodes with highest count 4578 Sponge count correct at completion Dispoto recovery stable  Indications: This is a 61 yof with left breast cancer and 9 cm area of enhancement on mri. She has multiple biopsies. I asked for entire area to be bracketed that needed to be removed. This did not include anterior clip.  I had mm available in the operating room. We also discussed sn biopsy  Procedure: After informed consent was obtained the patient was taken to the OR. She was injected with technetium in the standard periareolar fashion.She was given anitibiotics. SCDs were in place. She was prepped and draped in the standard sterile surgical fashion. A timeout was performed. She had undergone a pectoral block.  She had been previously marked by Dr Iran Planas. The seeds were both located in the lower outer quadrant with the anterior seed being closer to the skin. I made an elliptical incision over this that included some skin.  I then removed the seedswith an attempt to get a clear margin. This waspassed off the table after marking with paint.I did confirm removal of theclipsand seeds with mammography.the clip and seeds weredirectly in center of specimen. this did not include the anterior clip and I could not locate this without prior marking.  I obtained hemostasis. I then placed clips in the cavity. I then closed this with 2-0 vicryl, 3-0 vicryl and 4-0 monocryl. Glue and steristrips were applied. I then made an axillary incision and carried this through the axillary fascia.  I identified the sentinel nodes with the highest count as  above. There was no gross nodal disease.There was no background radioactivity. I obtained hemostasis. I closed the axillary fascia and breast tissue with 2-0 vicryl. I then closed the dermis with 3-0 vicryl and the skin with 4-0 monocryl.  Glue and steristrips were placed A breast binder was placed. She was extubated and transferred to recovery stable

## 2017-03-12 NOTE — H&P (Signed)
51 yof referred by Dr Augustin Coupe for newly diagnosed left breast cancer. she has fh in her mom (who I am seeing today also) and a cousin. she has no prior history of breast cancer or breast issues. she underwent screening mm that has b density breasts. she has lateral left breast that measures 9x2.5x1.7 cm [place] is normal.she has undergone two biopsies of this area that is hg dcis with comedo and cribriform patterns, er and pr positive. she has no mass or dc. genetic testing is negative now. she also has mri that shows about 9 cm enhancement that correlates with calcifications on the mm. the nodes are negative and the right breast is negative. she is here with her husband to discuss options. she has seen Dr Iran Planas to discuss reduction  Past Surgical History  Breast Biopsy  Left. Cesarean Section - 1  Colon Polyp Removal - Colonoscopy  Gallbladder Surgery - Laparoscopic  Oral Surgery  Spinal Surgery - Lower Back   Diagnostic Studies History  Colonoscopy  within last year Mammogram  within last year Pap Smear  1-5 years ago  Allergies  Septra *ANTI-INFECTIVE AGENTS - MISC.*  Headache. severe headache (sulfa drugs)  Medication History  Metoprolol Succinate ER (25MG  Tablet ER 24HR, Oral at bedtime) Active. Vitamin D (Ergocalciferol) (50000UNIT Capsule, Oral) Active. Levocetirizine Dihydrochloride (5MG  Tablet, Oral as needed) Active. Esomeprazole Magnesium (40MG  Capsule DR, Oral as needed) Active. Vitamin D (Cholecalciferol) (Oral) Specific strength unknown - Active. Cholestyramine (4GM Packet, Oral) Active. depo provera (one shot every 3 months) Active. Medications Reconciled  Social History No alcohol use  No caffeine use  No drug use  Tobacco use  Never smoker.  Family History  Arthritis  Family Members In General. Breast Cancer  Family Members In General. Cancer  Family Members In General. Cerebrovascular Accident  Family Members In [place], Family Members In General. Diabetes Mellitus  Family Members In General. Heart Disease  Family Members In General, Father. Heart disease in female family member before age 62  Hypertension  Family Members In General. Ischemic Bowel Disease  Mother. Melanoma  Family Members In General. Migraine Headache  Son. Rectal Cancer  Family Members In General. Respiratory Condition  Family Members In General. Seizure disorder  Mother.  Pregnancy / Birth History Age at menarche  1 years. Contraceptive History  Depo-provera, Oral contraceptives. Gravida  1 Irregular periods  Length (months) of breastfeeding  3-6 Maternal age  51-40 Para  1  Other Problems  Arthritis  Back Pain  Cholelithiasis  Crohn's Disease  Gastroesophageal Reflux Disease  Kidney Stone  Other disease, cancer, significant illness    Review of Systems  General Not Present- Appetite Loss, Chills, Fatigue, Fever, Night Sweats, Weight Gain and Weight Loss. Skin Not Present- Change in Wart/Mole, Dryness, Hives, Jaundice, New Lesions, Non-Healing Wounds, Rash and Ulcer. HEENT Present- Wears glasses/contact lenses. Not Present- Earache, Hearing Loss, Hoarseness, Nose Bleed, Oral Ulcers, Ringing in the Ears, Seasonal Allergies, Sinus Pain, Sore Throat, Visual Disturbances and Yellow Eyes. Respiratory Present- Snoring. Not Present- Bloody sputum, Chronic Cough, Difficulty Breathing and Wheezing. Breast Present- Breast Mass. Not Present- Breast Pain, Nipple Discharge and Skin Changes. Cardiovascular Not Present- Chest Pain, Difficulty Breathing Lying Down, Leg Cramps, Palpitations, Rapid Heart Rate, Shortness of Breath and Swelling of Extremities. Gastrointestinal Not Present- Abdominal Pain, Bloating, Bloody Stool, Change in Bowel Habits, Chronic diarrhea, Constipation, Difficulty Swallowing, Excessive gas, Gets full quickly at meals, Hemorrhoids, Indigestion, Nausea, Rectal Pain and  Vomiting. Female  Genitourinary Not Present- Frequency, Nocturia, Painful Urination, Pelvic Pain and Urgency. Musculoskeletal Not Present- Back Pain, Joint Pain, Joint Stiffness, Muscle Pain, Muscle Weakness and Swelling of Extremities. Neurological Not Present- Decreased Memory, Fainting, Headaches, Numbness, Seizures, Tingling, Tremor, Trouble walking and Weakness. Psychiatric Not Present- Anxiety, Bipolar, Change in Sleep Pattern, Depression, Fearful and Frequent crying. Endocrine Not Present- Cold Intolerance, Excessive Hunger, Hair Changes, Heat Intolerance, Hot flashes and New Diabetes. Hematology Not Present- Blood Thinners, Easy Bruising, Excessive bleeding, Gland problems, HIV and Persistent Infections.  Vitals Weight: 255.2 lb Height: 62in Body Surface Area: 2.12 m Body Mass Index: 46.68 kg/m  Temp.: 98.67F  Pulse: 68 (Regular)  BP: 134/80 (Sitting, Left Arm, Standard) Physical Exam  General Mental Status-Alert. Orientation-Oriented X3. Chest and Lung Exam Chest and lung exam reveals -on auscultation, normal breath sounds, no adventitious sounds and normal vocal resonance. Breast Nipples-No Discharge. Breast Lump-No Palpable Breast Mass. Note: large pendulous breasts Cardiovascular Cardiovascular examination reveals -normal heart sounds, regular rate and rhythm with no murmurs. Lymphatic Head & Neck General Head & Neck Lymphatics: Bilateral - Description - Normal. Axillary General Axillary Region: Bilateral - Description - Normal. Note: no [place] adenopathy   Assessment & Plan Rolm Bookbinder MD; 02/13/2017 10:19 AM) BREAST NEOPLASM, TIS (DCIS), LEFT (D05.12) Story: left breast seed bracketed lumpectomy, left axillary sn biopsy followed by reduction discussed lumpectomy with seed bracketing. discussed 5-10% positive margin risk necessitating a second surgery. We discussed a sentinel lymph node biopsy as she does not appear to having lymph node  involvement right now. We discussed the performance of that with injection of radioactive tracer and possibly blue dye. We discussed that she would have an incision underneath her axillary hairline or would be done through the same incision. We discussed that there is a chance of having a positive node with a sentinel lymph node biopsy and we will await the permanent pathology to make any other first further decisions in terms of her treatment. One of these options might be to return to the operating room to perform an axillary lymph node dissection. We discussed up to a 5% risk lifetime of chronic shoulder pain as well as lymphedema associated with a sentinel lymph node biopsy. will schedule reduction with Dr Iran Planas following lumpectomy

## 2017-03-12 NOTE — Anesthesia Postprocedure Evaluation (Signed)
Anesthesia Post Note  Patient: Anne Lawrence  Procedure(s) Performed: Procedure(s) (LRB): LEFT BREAST LUMPECTOMY WITH BRACKETED  RADIOACTIVE SEED AND SENTINEL LYMPH NODE BIOPSY (Left)  Patient location during evaluation: PACU Anesthesia Type: General and Regional Level of consciousness: awake and alert Pain management: pain level controlled Vital Signs Assessment: post-procedure vital signs reviewed and stable Respiratory status: spontaneous breathing, nonlabored ventilation and respiratory function stable Cardiovascular status: blood pressure returned to baseline and stable Postop Assessment: no signs of nausea or vomiting Anesthetic complications: no       Last Vitals:  Vitals:   03/12/17 1000 03/12/17 1007  BP:  122/74  Pulse: 74 66  Resp: 20 20  Temp: 36.4 C     Last Pain:  Vitals:   03/12/17 1007  PainSc: (P) 0-No pain                 Locke Barrell,W. EDMOND

## 2017-03-12 NOTE — Brief Op Note (Signed)
03/12/2017  8:59 AM  PATIENT:  Anne Lawrence  51 y.o. female  PRE-OPERATIVE DIAGNOSIS:  LEFT BREAST DCIS  POST-OPERATIVE DIAGNOSIS:  LEFT BREAST DCIS  PROCEDURE:  Procedure(s): LEFT BREAST LUMPECTOMY WITH BRACKETED  RADIOACTIVE SEED AND SENTINEL LYMPH NODE BIOPSY (Left)  SURGEON:  Surgeon(s) and Role:    * Rolm Bookbinder, MD - Primary  PHYSICIAN ASSISTANT:   ASSISTANTS: none   ANESTHESIA:   general  EBL:  No intake/output data recorded.  BLOOD ADMINISTERED:none  DRAINS: none   LOCAL MEDICATIONS USED:  OTHER pectoral block  SPECIMEN:  Lumpectomy  DISPOSITION OF SPECIMEN:  PATHOLOGY  COUNTS:  YES  TOURNIQUET:  * No tourniquets in log *  DICTATION: .Dragon Dictation  PLAN OF CARE: Discharge to home after PACU  PATIENT DISPOSITION:  PACU - hemodynamically stable.   Delay start of Pharmacological VTE agent (>24hrs) due to surgical blood loss or risk of bleeding: not applicable

## 2017-03-12 NOTE — Transfer of Care (Signed)
Immediate Anesthesia Transfer of Care Note  Patient: Anne Lawrence  Procedure(s) Performed: Procedure(s): LEFT BREAST LUMPECTOMY WITH BRACKETED  RADIOACTIVE SEED AND SENTINEL LYMPH NODE BIOPSY (Left)  Patient Location: PACU  Anesthesia Type:General  Level of Consciousness: awake, alert , oriented and patient cooperative  Airway & Oxygen Therapy: Patient Spontanous Breathing and Patient connected to nasal cannula oxygen  Post-op Assessment: Report given to RN, Post -op Vital signs reviewed and stable and Patient moving all extremities X 4  Post vital signs: Reviewed and stable  Last Vitals:  Vitals:   03/12/17 0606 03/12/17 0912  BP: 122/72   Pulse: 63   Resp: 20   Temp: 37 C (P) 36.5 C    Last Pain:  Vitals:   03/12/17 0912  PainSc: (P) 0-No pain      Patients Stated Pain Goal: 3 (20/23/34 3568)  Complications: No apparent anesthesia complications

## 2017-03-13 ENCOUNTER — Encounter (HOSPITAL_COMMUNITY): Payer: Self-pay | Admitting: General Surgery

## 2017-03-16 NOTE — H&P (Signed)
Subjective:     Patient ID: Anne Lawrence is a 51 y.o. female.  HPI  Here for follow up discussion prior to planned oncoplastic breast reconstruction. Presented following screening MMG with distortion left breast. Diagnostic MMG with distortion with associated suspicious calcifications in the lower slight lateral left breast measuring 9 x 2.5 x 1.7 cm. US showed no definite discrete lesions that correlate to the mammographic calcifications. Biopsy of two areas with DCIS, ER/PR+. MRI with clumped and linear enhancement spanning up to 9 cm consistent with the 9 cm span of calcifications seen on MMG.  She is now post operative from left lumpectomy with final pathology 1.8 cm IDC, 5.2 cm DCIS, margins clear (focally 0.2 cm from lateral margin). 0/3 SLN  Mother with breast ca diagnosed same time as patient. Brother with colon ca. Genetics negative on STAT breast cancer panel.  PMH significant for borderline DM. Notes elevated HbA1c 6.7 in past and discussed metformin; patient changed diet and notes this improved.  Current 50DD. Notes several year history neck and back pain and rashes beneath breasts. Latter worse in warm months, requires prescription medication to control at least 3 times per year. Has been specialty fitted for bra, has tried NSAIDS and Tylenol, has completed PT following back surgery with minimal change. Denies numbness of hands.  Lumpectomy no weight.  Wt down 18  Lbs since beginning of year through diet. Works part time as Solicitor. Has 71 yo son and husband at home.  Review of Systems      Objective:   Physical Exam  Cardiovascular: Normal rate, regular rhythm and normal heart sounds.   Pulmonary/Chest: Effort normal and breath sounds normal.  Genitourinary: No breast discharge.  Lymphadenopathy:    She has no axillary adenopathy.   No palpable masses Grade 3 ptosis bilateral SN to nipple R40 L 40 cm BW R 24 L 24 cm Nipple to IMF R 16 L 17 cm     Assessment:     IDC, DCIS left s/p lumpectomy, SLN Family history breast ca Macromastia Chronic neck and back pain Morbid obesity    Plan:     Plan oncoplastic reconstruction as staged procedure with reduction 7-10 d post lumpectomy to ensure pathologic clearance.Reviewed reduction with anchor type scars, drains, post operative visits and limitations, recovery. Diminished sensation nipple and breast skin, risk of nipple loss, wound healing problems, asymmetry. Reviewed if nipple dies, would require additional surgery to resect. Discussedsmaller breast size may aid with adjuvant radiation. Discussed will have some contraction of breast volume and increased firmness with radiation, less ptosis with aging. Discussed changes with wt gain, loss, aging. If she pursues partial mastectomy, highly encourage her to pursue breast reduction prior to XRT as risk of complications post would be very high. Counseled I cannot assure her bra size. Discussed specifically with her SN to nipple distance, she would be considered high risk for nipple loss.   Additional risks including but not limited to bleeding, seroma, hematoma, fat necrosis, unacceptable cosmetic appearance, DVT/PE, damage to deeper structures, cardiopulmonary complications discussed.   Plan OP surgery.   Irene Limbo, MD [facility] [phone], pin [phone]

## 2017-03-18 ENCOUNTER — Encounter (HOSPITAL_COMMUNITY): Payer: Self-pay | Admitting: *Deleted

## 2017-03-18 NOTE — Progress Notes (Signed)
Pt denies SOB, chest pain, and being under the care of a cardiologist. Pt denies having a stress test, echo and cardiac cath. Pt denies having a chest x ray within the last year. Pt made aware to stop taking Stop taking Aspirin, vitamins, Advil Sinus PSE, and herbal medications. Do not take any NSAIDs ie: Ibuprofen, Advil, Naproxen, BC and Goody Powder or any medication containing Aspirin. Pt verbalized understanding of all pre-op instructions.

## 2017-03-19 ENCOUNTER — Telehealth: Payer: Self-pay | Admitting: *Deleted

## 2017-03-19 ENCOUNTER — Encounter (HOSPITAL_COMMUNITY): Payer: Self-pay

## 2017-03-19 ENCOUNTER — Encounter: Payer: Self-pay | Admitting: *Deleted

## 2017-03-19 DIAGNOSIS — Z170 Estrogen receptor positive status [ER+]: Secondary | ICD-10-CM | POA: Insufficient documentation

## 2017-03-19 DIAGNOSIS — C50312 Malignant neoplasm of lower-inner quadrant of left female breast: Secondary | ICD-10-CM | POA: Insufficient documentation

## 2017-03-19 DIAGNOSIS — C50812 Malignant neoplasm of overlapping sites of left female breast: Secondary | ICD-10-CM | POA: Insufficient documentation

## 2017-03-19 HISTORY — DX: Estrogen receptor positive status (ER+): Z17.0

## 2017-03-19 HISTORY — DX: Estrogen receptor positive status (ER+): C50.812

## 2017-03-19 NOTE — Telephone Encounter (Signed)
Received order per Dr. Donne Hazel for oncotype testing. Requisition sent to pathology.

## 2017-03-19 NOTE — Anesthesia Preprocedure Evaluation (Addendum)
Anesthesia Evaluation  Patient identified by MRN, date of birth, ID band Patient awake    Reviewed: Allergy & Precautions, H&P , NPO status , Patient's Chart, lab work & pertinent test results  History of Anesthesia Complications (+) PONV  Airway Mallampati: II  TM Distance: >3 FB Neck ROM: Full    Dental no notable dental hx. (+) Teeth Intact, Dental Advisory Given   Pulmonary neg pulmonary ROS,    Pulmonary exam normal breath sounds clear to auscultation       Cardiovascular Exercise Tolerance: Good negative cardio ROS   Rhythm:Regular Rate:Normal     Neuro/Psych  Headaches, negative psych ROS   GI/Hepatic Neg liver ROS, GERD  Medicated and Controlled,  Endo/Other  Morbid obesity  Renal/GU negative Renal ROS  negative genitourinary   Musculoskeletal  (+) Arthritis , Osteoarthritis,    Abdominal   Peds  Hematology negative hematology ROS (+)   Anesthesia Other Findings   Reproductive/Obstetrics negative OB ROS                           Anesthesia Physical Anesthesia Plan  ASA: III  Anesthesia Plan: General   Post-op Pain Management:    Induction: Intravenous  Airway Management Planned: Oral ETT  Additional Equipment:   Intra-op Plan:   Post-operative Plan: Extubation in OR  Informed Consent: I have reviewed the patients History and Physical, chart, labs and discussed the procedure including the risks, benefits and alternatives for the proposed anesthesia with the patient or authorized representative who has indicated his/her understanding and acceptance.   Dental advisory given  Plan Discussed with: CRNA  Anesthesia Plan Comments:         Anesthesia Quick Evaluation

## 2017-03-20 ENCOUNTER — Encounter (HOSPITAL_COMMUNITY): Payer: Self-pay | Admitting: *Deleted

## 2017-03-20 ENCOUNTER — Ambulatory Visit (HOSPITAL_COMMUNITY)
Admission: RE | Admit: 2017-03-20 | Discharge: 2017-03-20 | Disposition: A | Discharge status: Home / Self Care | Payer: PRIVATE HEALTH INSURANCE | Source: Ambulatory Visit | Attending: Plastic Surgery | Admitting: Plastic Surgery

## 2017-03-20 ENCOUNTER — Ambulatory Visit (HOSPITAL_COMMUNITY): Payer: PRIVATE HEALTH INSURANCE | Admitting: Anesthesiology

## 2017-03-20 ENCOUNTER — Encounter (HOSPITAL_COMMUNITY)
Admission: RE | Disposition: A | Discharge status: Home / Self Care | Payer: Self-pay | Source: Ambulatory Visit | Attending: Plastic Surgery

## 2017-03-20 DIAGNOSIS — Z800 Family history of malignant neoplasm of digestive organs: Secondary | ICD-10-CM | POA: Insufficient documentation

## 2017-03-20 DIAGNOSIS — Z803 Family history of malignant neoplasm of breast: Secondary | ICD-10-CM | POA: Diagnosis not present

## 2017-03-20 DIAGNOSIS — R7303 Prediabetes: Secondary | ICD-10-CM | POA: Insufficient documentation

## 2017-03-20 DIAGNOSIS — Z6841 Body Mass Index (BMI) 40.0 and over, adult: Secondary | ICD-10-CM | POA: Diagnosis not present

## 2017-03-20 DIAGNOSIS — Z79899 Other long term (current) drug therapy: Secondary | ICD-10-CM | POA: Insufficient documentation

## 2017-03-20 DIAGNOSIS — K219 Gastro-esophageal reflux disease without esophagitis: Secondary | ICD-10-CM | POA: Diagnosis not present

## 2017-03-20 DIAGNOSIS — Z9012 Acquired absence of left breast and nipple: Secondary | ICD-10-CM | POA: Diagnosis not present

## 2017-03-20 DIAGNOSIS — N62 Hypertrophy of breast: Secondary | ICD-10-CM | POA: Insufficient documentation

## 2017-03-20 DIAGNOSIS — Z853 Personal history of malignant neoplasm of breast: Secondary | ICD-10-CM | POA: Insufficient documentation

## 2017-03-20 HISTORY — DX: Hypertrophy of breast: N62

## 2017-03-20 HISTORY — DX: Bronchitis, not specified as acute or chronic: J40

## 2017-03-20 HISTORY — PX: BREAST REDUCTION SURGERY: SHX8

## 2017-03-20 LAB — BASIC METABOLIC PANEL
Anion gap: 10 (ref 5–15)
BUN: 12 mg/dL (ref 6–20)
CALCIUM: 8.7 mg/dL — AB (ref 8.9–10.3)
CHLORIDE: 107 mmol/L (ref 101–111)
CO2: 22 mmol/L (ref 22–32)
CREATININE: 0.62 mg/dL (ref 0.44–1.00)
GFR calc non Af Amer: >60 mL/min (ref >60)
Glucose, Bld: 117 mg/dL — ABNORMAL HIGH (ref 65–99)
Potassium: 3.7 mmol/L (ref 3.5–5.1)
Sodium: 139 mmol/L (ref 135–145)

## 2017-03-20 LAB — CBC
HEMATOCRIT: 38.2 % (ref 36.0–46.0)
HEMOGLOBIN: 12.4 g/dL (ref 12.0–15.0)
MCH: 27 pg (ref 26.0–34.0)
MCHC: 32.5 g/dL (ref 30.0–36.0)
MCV: 83 fL (ref 78.0–100.0)
Platelets: 250 10*3/uL (ref 150–400)
RBC: 4.6 MIL/uL (ref 3.87–5.11)
RDW: 13.2 % (ref 11.5–15.5)
WBC: 10.8 10*3/uL — ABNORMAL HIGH (ref 4.0–10.5)

## 2017-03-20 SURGERY — MAMMOPLASTY, REDUCTION
Anesthesia: General | Site: Breast | Laterality: Bilateral

## 2017-03-20 MED ORDER — FENTANYL CITRATE (PF) 250 MCG/5ML IJ SOLN
INTRAMUSCULAR | Status: AC
  Filled 2017-03-20: qty 5

## 2017-03-20 MED ORDER — HYDROMORPHONE HCL 1 MG/ML IJ SOLN
INTRAMUSCULAR | Status: AC
  Filled 2017-03-20: qty 1

## 2017-03-20 MED ORDER — PROPOFOL 10 MG/ML IV BOLUS
INTRAVENOUS | Status: DC | PRN
  Administered 2017-03-20: 130 mg via INTRAVENOUS

## 2017-03-20 MED ORDER — SUGAMMADEX SODIUM 200 MG/2ML IV SOLN
INTRAVENOUS | Status: DC | PRN
  Administered 2017-03-20: 250 mg via INTRAVENOUS

## 2017-03-20 MED ORDER — SUGAMMADEX SODIUM 500 MG/5ML IV SOLN
INTRAVENOUS | Status: AC
  Filled 2017-03-20: qty 5

## 2017-03-20 MED ORDER — CEFAZOLIN SODIUM-DEXTROSE 2-4 GM/100ML-% IV SOLN
2.0000 g | INTRAVENOUS | Status: AC
  Administered 2017-03-20: 2 g via INTRAVENOUS
  Filled 2017-03-20: qty 100

## 2017-03-20 MED ORDER — LIDOCAINE 2% (20 MG/ML) 5 ML SYRINGE
INTRAMUSCULAR | Status: AC
  Filled 2017-03-20: qty 5

## 2017-03-20 MED ORDER — HYDROMORPHONE HCL 1 MG/ML IJ SOLN
INTRAMUSCULAR | Status: AC
  Filled 2017-03-20: qty 0.5

## 2017-03-20 MED ORDER — PROPOFOL 10 MG/ML IV BOLUS
INTRAVENOUS | Status: AC
  Filled 2017-03-20: qty 20

## 2017-03-20 MED ORDER — CHLORHEXIDINE GLUCONATE CLOTH 2 % EX PADS: 6.0000 | MEDICATED_PAD | Freq: Once | CUTANEOUS | Status: DC

## 2017-03-20 MED ORDER — DEXTROSE 5 % IV SOLN
INTRAVENOUS | Status: DC | PRN
  Administered 2017-03-20: 20 ug/min via INTRAVENOUS

## 2017-03-20 MED ORDER — DEXAMETHASONE SODIUM PHOSPHATE 10 MG/ML IJ SOLN
INTRAMUSCULAR | Status: AC
  Filled 2017-03-20: qty 1

## 2017-03-20 MED ORDER — MIDAZOLAM HCL 2 MG/2ML IJ SOLN
INTRAMUSCULAR | Status: AC
  Filled 2017-03-20: qty 2

## 2017-03-20 MED ORDER — ONDANSETRON HCL 4 MG/2ML IJ SOLN
INTRAMUSCULAR | Status: AC
  Filled 2017-03-20: qty 2

## 2017-03-20 MED ORDER — ROCURONIUM BROMIDE 10 MG/ML (PF) SYRINGE
PREFILLED_SYRINGE | INTRAVENOUS | Status: DC | PRN
  Administered 2017-03-20: 10 mg via INTRAVENOUS
  Administered 2017-03-20: 50 mg via INTRAVENOUS
  Administered 2017-03-20 (×2): 20 mg via INTRAVENOUS

## 2017-03-20 MED ORDER — ACETAMINOPHEN 500 MG PO TABS
1000.0000 mg | ORAL_TABLET | ORAL | Status: AC
  Administered 2017-03-20: 1000 mg via ORAL
  Filled 2017-03-20: qty 2

## 2017-03-20 MED ORDER — ONDANSETRON HCL 4 MG/2ML IJ SOLN
INTRAMUSCULAR | Status: DC | PRN
  Administered 2017-03-20: 4 mg via INTRAVENOUS

## 2017-03-20 MED ORDER — 0.9 % SODIUM CHLORIDE (POUR BTL) OPTIME
TOPICAL | Status: DC | PRN
  Administered 2017-03-20: 1000 mL

## 2017-03-20 MED ORDER — HEPARIN SODIUM (PORCINE) 5000 UNIT/ML IJ SOLN
5000.0000 [IU] | INTRAMUSCULAR | Status: AC
  Administered 2017-03-20: 5000 [IU] via SUBCUTANEOUS
  Filled 2017-03-20: qty 1

## 2017-03-20 MED ORDER — MIDAZOLAM HCL 5 MG/5ML IJ SOLN
INTRAMUSCULAR | Status: DC | PRN
  Administered 2017-03-20: 2 mg via INTRAVENOUS

## 2017-03-20 MED ORDER — LACTATED RINGERS IV SOLN
INTRAVENOUS | Status: DC | PRN
  Administered 2017-03-20 (×3): via INTRAVENOUS

## 2017-03-20 MED ORDER — DEXAMETHASONE SODIUM PHOSPHATE 10 MG/ML IJ SOLN
INTRAMUSCULAR | Status: DC | PRN
  Administered 2017-03-20: 10 mg via INTRAVENOUS

## 2017-03-20 MED ORDER — BUPIVACAINE LIPOSOME 1.3 % IJ SUSP
20.0000 mL | INTRAMUSCULAR | Status: AC
  Administered 2017-03-20: 20 mL
  Filled 2017-03-20: qty 20

## 2017-03-20 MED ORDER — GABAPENTIN 300 MG PO CAPS
300.0000 mg | ORAL_CAPSULE | ORAL | Status: AC
  Administered 2017-03-20: 300 mg via ORAL
  Filled 2017-03-20: qty 1

## 2017-03-20 MED ORDER — LIDOCAINE 2% (20 MG/ML) 5 ML SYRINGE
INTRAMUSCULAR | Status: DC | PRN
  Administered 2017-03-20: 60 mg via INTRAVENOUS

## 2017-03-20 MED ORDER — SODIUM CHLORIDE 0.9 % IJ SOLN
INTRAMUSCULAR | Status: DC | PRN
  Administered 2017-03-20: 20 mL

## 2017-03-20 MED ORDER — HYDROMORPHONE HCL 1 MG/ML IJ SOLN
0.2500 mg | INTRAMUSCULAR | Status: DC | PRN
  Administered 2017-03-20 (×3): 0.5 mg via INTRAVENOUS

## 2017-03-20 MED ORDER — FENTANYL CITRATE (PF) 100 MCG/2ML IJ SOLN
INTRAMUSCULAR | Status: DC | PRN
  Administered 2017-03-20: 50 ug via INTRAVENOUS
  Administered 2017-03-20: 100 ug via INTRAVENOUS
  Administered 2017-03-20 (×5): 50 ug via INTRAVENOUS

## 2017-03-20 MED ORDER — ROCURONIUM BROMIDE 10 MG/ML (PF) SYRINGE
PREFILLED_SYRINGE | INTRAVENOUS | Status: AC
  Filled 2017-03-20: qty 5

## 2017-03-20 SURGICAL SUPPLY — 60 items
ADH SKN CLS APL DERMABOND .7 (GAUZE/BANDAGES/DRESSINGS) ×3
APPLIER CLIP 9.375 MED OPEN (MISCELLANEOUS) ×3
APR CLP MED 9.3 20 MLT OPN (MISCELLANEOUS) ×1
BAG DECANTER FOR FLEXI CONT (MISCELLANEOUS) ×1 IMPLANT
BINDER BREAST LRG (GAUZE/BANDAGES/DRESSINGS) IMPLANT
BINDER BREAST MEDIUM (GAUZE/BANDAGES/DRESSINGS) IMPLANT
BINDER BREAST XLRG (GAUZE/BANDAGES/DRESSINGS) IMPLANT
BINDER BREAST XXLRG (GAUZE/BANDAGES/DRESSINGS) ×2 IMPLANT
BLADE HEX COATED 2.75 (ELECTRODE) ×3 IMPLANT
BLADE SURG 10 STRL SS (BLADE) ×16 IMPLANT
BLADE SURG 15 STRL LF DISP TIS (BLADE) ×2 IMPLANT
BLADE SURG 15 STRL SS (BLADE) ×6
BNDG GAUZE ELAST 4 BULKY (GAUZE/BANDAGES/DRESSINGS) ×6 IMPLANT
CANISTER SUCTION 1200CC (MISCELLANEOUS) ×3 IMPLANT
CHLORAPREP W/TINT 26ML (MISCELLANEOUS) ×3 IMPLANT
CLIP APPLIE 9.375 MED OPEN (MISCELLANEOUS) IMPLANT
DECANTER SPIKE VIAL GLASS SM (MISCELLANEOUS) IMPLANT
DERMABOND ADVANCED (GAUZE/BANDAGES/DRESSINGS) ×6
DERMABOND ADVANCED .7 DNX12 (GAUZE/BANDAGES/DRESSINGS) ×1 IMPLANT
DRAIN CHANNEL 15F RND FF W/TCR (WOUND CARE) ×5 IMPLANT
DRAPE LAPAROSCOPIC ABDOMINAL (DRAPES) ×3 IMPLANT
DRAPE UNIVERSAL PACK (DRAPES) ×3 IMPLANT
DRSG PAD ABDOMINAL 8X10 ST (GAUZE/BANDAGES/DRESSINGS) ×6 IMPLANT
ELECT BLADE 4.0 EZ CLEAN MEGAD (MISCELLANEOUS) ×3
ELECT COATED BLADE 2.86 ST (ELECTRODE) ×3 IMPLANT
ELECT REM PT RETURN 9FT ADLT (ELECTROSURGICAL) ×3
ELECTRODE BLDE 4.0 EZ CLN MEGD (MISCELLANEOUS) ×1 IMPLANT
ELECTRODE REM PT RTRN 9FT ADLT (ELECTROSURGICAL) ×1 IMPLANT
EVACUATOR SILICONE 100CC (DRAIN) ×4 IMPLANT
GAUZE SPONGE 4X4 12PLY STRL (GAUZE/BANDAGES/DRESSINGS) ×4 IMPLANT
GLOVE BIO SURGEON STRL SZ 6 (GLOVE) ×10 IMPLANT
GOWN STRL REUS W/ TWL LRG LVL3 (GOWN DISPOSABLE) ×2 IMPLANT
GOWN STRL REUS W/TWL LRG LVL3 (GOWN DISPOSABLE) ×6
NDL HYPO 25GX1X1/2 BEV (NEEDLE) IMPLANT
NDL HYPO 25X1 1.5 SAFETY (NEEDLE) IMPLANT
NDL SPNL 22GX3.5 QUINCKE BK (NEEDLE) ×1 IMPLANT
NEEDLE HYPO 25GX1X1/2 BEV (NEEDLE) ×3 IMPLANT
NEEDLE HYPO 25X1 1.5 SAFETY (NEEDLE) IMPLANT
NEEDLE SPNL 22GX3.5 QUINCKE BK (NEEDLE) IMPLANT
NS IRRIG 1000ML POUR BTL (IV SOLUTION) ×3 IMPLANT
PACK GENERAL/GYN (CUSTOM PROCEDURE TRAY) ×3 IMPLANT
SPONGE LAP 18X18 X RAY DECT (DISPOSABLE) ×12 IMPLANT
STAPLER VISISTAT 35W (STAPLE) ×5 IMPLANT
SUT ETHILON 2 0 FS 18 (SUTURE) ×7 IMPLANT
SUT MNCRL AB 4-0 PS2 18 (SUTURE) ×17 IMPLANT
SUT SILK 2 0 FS (SUTURE) ×2 IMPLANT
SUT VIC AB 3-0 PS1 18 (SUTURE) ×6
SUT VIC AB 3-0 PS1 18XBRD (SUTURE) IMPLANT
SUT VIC AB 3-0 PS2 18 (SUTURE) ×21 IMPLANT
SUT VIC AB 3-0 PS2 18XBRD (SUTURE) ×3 IMPLANT
SUT VIC AB 3-0 SH 27 (SUTURE)
SUT VIC AB 3-0 SH 27X BRD (SUTURE) IMPLANT
SUT VICRYL 4-0 PS2 18IN ABS (SUTURE) ×13 IMPLANT
SYR 50ML LL SCALE MARK (SYRINGE) IMPLANT
SYR CONTROL 10ML LL (SYRINGE) ×2 IMPLANT
TOWEL OR 17X24 6PK STRL BLUE (TOWEL DISPOSABLE) ×6 IMPLANT
TRAY FOLEY W/METER SILVER 16FR (SET/KITS/TRAYS/PACK) ×2 IMPLANT
TUBE CONNECTING 20'X1/4 (TUBING) ×1
TUBE CONNECTING 20X1/4 (TUBING) ×2 IMPLANT
YANKAUER SUCT BULB TIP NO VENT (SUCTIONS) ×3 IMPLANT

## 2017-03-20 NOTE — Transfer of Care (Signed)
Immediate Anesthesia Transfer of Care Note  Patient: Anne Lawrence  Procedure(s) Performed: Procedure(s): BILATERAL ONCOPLASTIC BREAST REDUCTION (Bilateral)  Patient Location: PACU  Anesthesia Type:General  Level of Consciousness: awake, alert  and oriented  Airway & Oxygen Therapy: Patient Spontanous Breathing and Patient connected to nasal cannula oxygen  Post-op Assessment: Report given to RN, Post -op Vital signs reviewed and stable and Patient moving all extremities X 4  Post vital signs: Reviewed and stable  Last Vitals:  Vitals:   03/20/17 0602 03/20/17 1127  BP: 123/63   Pulse: 76   Resp: 20 (P) 15  Temp: 36.8 C (P) 36.4 C    Last Pain:  Vitals:   03/20/17 0602  TempSrc: Oral         Complications: No apparent anesthesia complications

## 2017-03-20 NOTE — Anesthesia Postprocedure Evaluation (Signed)
Anesthesia Post Note  Patient: Anne Lawrence  Procedure(s) Performed: Procedure(s) (LRB): BILATERAL ONCOPLASTIC BREAST REDUCTION (Bilateral)  Patient location during evaluation: PACU Anesthesia Type: General Level of consciousness: awake and alert Pain management: pain level controlled Vital Signs Assessment: post-procedure vital signs reviewed and stable Respiratory status: spontaneous breathing, nonlabored ventilation and respiratory function stable Cardiovascular status: blood pressure returned to baseline and stable Postop Assessment: no signs of nausea or vomiting Anesthetic complications: no       Last Vitals:  Vitals:   03/20/17 1240 03/20/17 1252  BP: 105/69 100/62  Pulse: 88 84  Resp: 17   Temp:  36.6 C    Last Pain:  Vitals:   03/20/17 1323  TempSrc:   PainSc: 5                  Kelita Wallis,W. EDMOND

## 2017-03-20 NOTE — Interval H&P Note (Signed)
History and Physical Interval Note:  03/20/2017 7:06 AM  Anne Lawrence  has presented today for surgery, with the diagnosis of HISTORY OF LEFT BREAST HYPERTROPHY  The various methods of treatment have been discussed with the patient and family. After consideration of risks, benefits and other options for treatment, the patient has consented to  Bilateral oncoplastic breast reduction as a surgical intervention .  The patient's history has been reviewed, patient examined, no change in status, stable for surgery.  I have reviewed the patient's chart and labs.  Questions were answered to the patient's satisfaction.     [name]

## 2017-03-20 NOTE — Op Note (Signed)
Operative Note   DATE OF OPERATION: 4.27.18  LOCATION: [facility]-outpatient  SURGICAL DIVISION: Plastic Surgery  PREOPERATIVE DIAGNOSES:  1. Macromastia 2 Left breast cancer LOQ, ER+ 3.Chronic neck and back pain  POSTOPERATIVE DIAGNOSES:  same  PROCEDURE:  Bilateral oncoplastic breast reduction  SURGEON: Irene Limbo MD MBA  ASSISTANT: none  ANESTHESIA:  General.   EBL: 381 ml  COMPLICATIONS: None immediate.   INDICATIONS FOR PROCEDURE:  The patient, Anne Lawrence, is a 51 y.o. female born on 12/27/1965, is here for staged breast reconstruction following left lumpectomy in setting of macromastia.   FINDINGS: Right breast reduction 1036 Total weight left breast reduction 1066 g.  DESCRIPTION OF PROCEDURE:  The patient was marked standing in the preoperative area to mark sternal notch, chest midline, anterior axillary lines, inframammary folds. The location of new nipple areolar complex was marked at level of on inframammary fold on anterior surface breast by palpation. This was marked symmetric over bilateral breasts. With aid of Wise pattern marker, location of new nipple areolar complex and vertical limbs (8.5 cm) were marked. The patient was taken to the operating room. SCDs were placed and IV antibiotics were given. The patient's operative site was prepped and draped in a sterile fashion. A time out was performed and all information was confirmed to be correct.   Over left breast, inferior pedicle marked and nipple areolar complex marked with 50 mm diameter marker. Pedicle deepithlialized and developedto chest wall. Medial and lateral breast tissue resected, as well as in area of planded inset of nipple areola superiorly.  Lumpectomy cavity was located over lower outer quadrant. The lateral margin of this was excised with reduction and sent as separate specimen. The medial lumpectomy cavity represented the lateral border of inferior pedicle. Medial and lateral flaps  developed. Breast tailor tacked closed.  I then directed attention to right breast where superomedial pedicle designed. The pedicle was deepithelialized and developed to 5-6 cm thickness and toward chest wall until tension free closure obtained.. Lower pole breast tissue excised. Breast tailor tacked closed, and patient brought to upright sitting position and assessed for symmetry. Patent returned to supine position and breast cavities irrigated and hemostasis obtained. Exparel infiltrated throughout each breast. 15 Fr JP placed in each breast and secured with 2-0 nylon. Closure completed with 3-0 vicryl to approximate dermis along inframammary fold and vertical limb. NAC inset with 4-0 vicryl in dermis. Skin closure completed with 4-0 monocryl subcuticular throughout.Tissue adhesive applied. Dry dressing and breast binder applied.  The patient was allowed to wake from anesthesia, extubated and taken to the recovery room in satisfactory condition.   SPECIMENS:1. Left breast reduction, lumpectomy cavity 2. Left breast reduction 3. Right breast reduction  DRAINS: 15 Fr JP in right and left breast  Irene Limbo, MD [facility]astic & Reconstructive Surgery [phone], pin [phone]

## 2017-03-20 NOTE — Anesthesia Procedure Notes (Addendum)
Procedure Name: Intubation Date/Time: 03/20/2017 7:40 AM Performed by: Garrison Columbus T Pre-anesthesia Checklist: Patient identified, Emergency Drugs available, Suction available and Patient being monitored Patient Re-evaluated:Patient Re-evaluated prior to inductionOxygen Delivery Method: Circle System Utilized Preoxygenation: Pre-oxygenation with 100% oxygen Intubation Type: IV induction Ventilation: Mask ventilation without difficulty and Oral airway inserted - appropriate to patient size Laryngoscope Size: Sabra Heck and 2 Grade View: Grade I Tube type: Oral Tube size: 7.5 mm Number of attempts: 1 Airway Equipment and Method: Stylet and Oral airway Placement Confirmation: ETT inserted through vocal cords under direct vision,  positive ETCO2 and breath sounds checked- equal and bilateral Secured at: 21 cm Tube secured with: Tape Dental Injury: Teeth and Oropharynx as per pre-operative assessment

## 2017-03-21 ENCOUNTER — Encounter (HOSPITAL_COMMUNITY): Payer: Self-pay | Admitting: Plastic Surgery

## 2017-03-27 ENCOUNTER — Encounter (HOSPITAL_COMMUNITY): Payer: Self-pay

## 2017-04-01 ENCOUNTER — Ambulatory Visit: Payer: PRIVATE HEALTH INSURANCE | Attending: General Surgery | Admitting: Physical Therapy

## 2017-04-01 DIAGNOSIS — M25512 Pain in left shoulder: Secondary | ICD-10-CM | POA: Diagnosis present

## 2017-04-01 DIAGNOSIS — M25611 Stiffness of right shoulder, not elsewhere classified: Secondary | ICD-10-CM | POA: Diagnosis present

## 2017-04-01 DIAGNOSIS — M25612 Stiffness of left shoulder, not elsewhere classified: Secondary | ICD-10-CM | POA: Diagnosis present

## 2017-04-01 NOTE — Therapy (Signed)
[place], [name], [address] Phone: [phone]   Fax:  [phone]  Physical Therapy Evaluation  Patient Details  Name: Anne Lawrence MRN: [id number] Date of Birth: 1966/05/30 Referring Provider: Dr. Serita Grammes  Encounter Date: 04/01/2017      PT End of Session - 04/01/17 1245    Visit Number 1   Number of Visits 9   Date for PT Re-Evaluation 05/22/17   PT Start Time 5732   PT Stop Time 1110   PT Time Calculation (min) 55 min   Activity Tolerance Patient tolerated treatment well   Behavior During Therapy [facility] for tasks assessed/performed      Past Medical History:  Diagnosis Date  . Arthritis   . Breast cancer (HCC)    DCIS ER/PR+  . Bronchitis   . Family history of breast cancer   . Family history of colon cancer   . GERD (gastroesophageal reflux disease)   . Headache   . History of kidney stones   . Macromastia   . PONV (postoperative nausea and vomiting)    n/v with [phone]  . Rapid heart beat    takes metoprolol    Past Surgical History:  Procedure Laterality Date  . BACK SURGERY  1998  . BREAST LUMPECTOMY WITH RADIOACTIVE SEED AND SENTINEL LYMPH NODE BIOPSY Left 03/12/2017   Procedure: LEFT BREAST LUMPECTOMY WITH BRACKETED  RADIOACTIVE SEED AND SENTINEL LYMPH NODE BIOPSY;  Surgeon: Rolm Bookbinder, MD;  Location: [place];  Service: General;  Laterality: Left;  . BREAST REDUCTION SURGERY Bilateral 03/20/2017   Procedure: BILATERAL ONCOPLASTIC BREAST REDUCTION;  Surgeon: Irene Limbo, MD;  Location: [place];  Service: Plastics;  Laterality: Bilateral;  . CHOLECYSTECTOMY  1998  . LIPOMA EXCISION Right 04/2016   shoulder blade area  . WISDOM TOOTH EXTRACTION      There were no vitals filed for this visit.       Subjective Assessment - 04/01/17 1024    Subjective Here for lumpectomy follow-up appointment.   Pertinent History Left breast cancer diagnosed 01/07/17. Went  for needle biopsy and breast MRI.  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  Tumor was 9 cm. so had reduction on both sides by Dr. Iran Planas on 03/20/17.  Will have XRT later this spring.  Has a rapid heart breat so is on Toprol.  Reports her knees pop, left > right, and hurt on stairs (or hips hurt).  Has seen rhematologist who thought she had psoriatic arthritis and was on methotrexate for a while, but she stopped that.  This bothers mainly in her hands; hips and shoulders were helped with a new mattress and hands and hips helped with cleaner eating. Gets left leg pain (buttocks to left knee on lateral aspect) and it goes numb with prolonged standing or walking. Had ruptured disc with microdiscectomy in 1998 for right side symptoms, which was successful, but has occasional flareup.   Patient Stated Goals get post-op checkup   Currently in Pain? Yes   Pain Score 2    Pain Location Breast   Pain Orientation Right;Left   Pain Descriptors / Indicators Other (Comment)  like beestings   Pain Frequency Intermittent   Aggravating Factors  not doing much   Pain Relieving Factors massaging her breasts            [facility] PT Assessment - 04/01/17 0001      Assessment  Medical Diagnosis left breast cancer with lumpectomy and SLNB, bilateral reductions   Referring Provider Dr. Serita Grammes   Onset Date/Surgical Date 03/12/17   Hand Dominance Right   Prior Therapy none     Precautions   Precaution Comments no housework, no lifting per plastic surgeon; cancer precautions     Restrictions   Weight Bearing Restrictions No     Balance Screen   Has the patient fallen in the past 6 months No  but passed out last week   Has the patient had a decrease in activity level because of a fear of falling?  No   Is the patient reluctant to leave their home because of a fear of falling?  No     Home Social worker Private  residence   Living Arrangements Spouse/significant other;Children;Parent  mother got breast cancer diagnosis same day; also MI   Type of Home Mobile home  double wide     Prior Function   Level of Independence Independent   Vocation Part time employment  but not currently working   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 5 inches from lipoma removal; left breast at inferior aspect of nipple has a 0.5 cm. open area; under each breast there are healing incisions, and both have white tissue where it is not fully healed.     ROM / Strength   AROM / PROM / Strength AROM     AROM   AROM Assessment Site Shoulder   Right/Left Shoulder Right;Left   Right Shoulder Flexion 120 Degrees  tends toward scaption   Right Shoulder ABduction 106 Degrees   Right Shoulder Internal Rotation 40 Degrees   Right Shoulder External Rotation 87 Degrees   Left Shoulder Flexion 120 Degrees  tends toward scaption   Left Shoulder ABduction 100 Degrees   Left Shoulder Internal Rotation 70 Degrees   Left Shoulder External Rotation 90 Degrees           LYMPHEDEMA/ONCOLOGY QUESTIONNAIRE - 04/01/17 1054      Type   Cancer Type left breast     Surgeries   Lumpectomy Date 03/12/17  reduction 03/20/17   Number Lymph Nodes Removed 3     Lymphedema Assessments   Lymphedema Assessments Upper extremities     Right Upper Extremity Lymphedema   10 cm Proximal to Olecranon Process 34.2 cm   Olecranon Process 26.9 cm   10 cm Proximal to Ulnar Styloid Process 24.2 cm   Just Proximal to Ulnar Styloid Process 17 cm   Across Hand at [employer] 19.3 cm   At [address] of 2nd Digit 5.9 cm     Left Upper Extremity Lymphedema   10 cm Proximal to Olecranon  Process 34.2 cm   Olecranon Process 25.9 cm   10 cm Proximal to Ulnar Styloid Process 23 cm   Just Proximal to Ulnar Styloid Process 16.4 cm   Across Hand at [employer] 18.6 cm   At [place] of 2nd Digit 5.8 cm                                [facility]  Goals - 04/01/17 1254      CC Long Term Goal  #1   Title Pt. will be independent in HEP for both shoulder ROM.   Time 4   Period Weeks   Status New     CC Long Term Goal  #2   Title Both shoulder active abduction to at least 160 degrees for improved ADLs.   Time 4   Period Weeks   Status New     CC Long Term Goal  #3   Title Both shoulder active flexion to at least 150 degrees for improved overhead reach.   Time 4   Period Weeks   Status New     CC Long Term Goal  #4   Title Pain with movement of arms decreased at least 50%.   Time 4   Period Weeks   Status New            Plan - 04/01/17 1245    Clinical Impression Statement This is a pleasant woman s/p left breast lumpectomy with SLNB 03/12/17 and bilat. breast reduction 03/20/17 here for post-op therapy assessment.  She has been told by her plastic surgeon not to lift anything nor do housework.  She has healing incisions around both nipples, the left with a small open area at inferior aspect, and underneath both breasts.  She has some concerns about those and was going to stop in at Dr. Para Skeans office to ask them to check these areas. She has limitations in A/ROM of both shoulders, not unreasonable at this stage, but will need instruction in exercise and perhaps some manual therapy to regain full motion.  UE circumference measurements were taken today to have baselines of those.  She expects to have radiation treatment, but dates for that are not yet set.  She may not be able to get into position for this at this time.  She may or may not need chemo.  Eval today is moderate complexity with comorbidity of reduction surgery in addition to the  lumpectomy and with wounds that are still healing and lifting restrictions; evolving with patient still healing and to start radiation treatment.    Rehab Potential Good   Clinical Impairments Affecting Rehab Potential still with plastic surgery restrictions   PT Frequency 2x / week   PT Duration 2 weeks  to 4 weeks prn   PT Treatment/Interventions ADLs/Self Care Home Management;Therapeutic exercise;Patient/family education;Manual techniques;Manual lymph drainage;Scar mobilization;Passive range of motion   PT Next Visit Plan Will check first with plastic surgeon to find out when she okays patient to do therapy.  Then begin with P/AA/A/ROM to both shoulders and instruction in HEP.   Consulted and Agree with Plan of Care Patient      Patient will benefit from skilled therapeutic intervention in order to improve the following deficits and impairments:  Decreased range of motion, Impaired UE functional use, Pain  Visit Diagnosis: Stiffness of left shoulder, not elsewhere classified - Plan: PT plan of care cert/re-cert  Stiffness of right shoulder, not elsewhere classified - Plan: PT plan of care cert/re-cert  Acute pain of left shoulder - Plan: PT plan of care cert/re-cert     Problem List Patient Active Problem List   Diagnosis Date Noted  . Breast cancer of lower-inner quadrant of left female breast ([place]) 03/19/2017  . Genetic testing 01/30/2017  . Breast cancer ([place])   . Family history of breast cancer   . Family history of colon cancer  Nina 04/01/2017, 12:58 PM  [place], [name], [address] Phone: [phone]   Fax:  [phone]  Name: Anne Lawrence MRN: [id number] Date of Birth: 11-12-1966  Serafina Royals, PT 04/01/17 12:58 PM

## 2017-04-03 ENCOUNTER — Telehealth: Payer: Self-pay | Admitting: *Deleted

## 2017-04-03 NOTE — Telephone Encounter (Signed)
Received a fax from [facility] stating that Insufficient Carcinoma Present & Oncotype Dx test cannot be performed.

## 2017-04-07 ENCOUNTER — Ambulatory Visit: Payer: PRIVATE HEALTH INSURANCE

## 2017-04-07 DIAGNOSIS — M25611 Stiffness of right shoulder, not elsewhere classified: Secondary | ICD-10-CM

## 2017-04-07 DIAGNOSIS — M25612 Stiffness of left shoulder, not elsewhere classified: Secondary | ICD-10-CM | POA: Diagnosis not present

## 2017-04-07 DIAGNOSIS — M25512 Pain in left shoulder: Secondary | ICD-10-CM

## 2017-04-07 NOTE — Therapy (Signed)
[place], [name], [address] Phone: [phone]   Fax:  [phone]  Physical Therapy Treatment  Patient Details  Name: Anne Lawrence MRN: [id number] Date of Birth: 09-23-1966 Referring Provider: Dr. Serita Grammes  Encounter Date: 04/07/2017      PT End of Session - 04/07/17 1105    Visit Number 2   Number of Visits 9   Date for PT Re-Evaluation 05/22/17   PT Start Time 4742   PT Stop Time 1105   PT Time Calculation (min) 42 min   Activity Tolerance Patient tolerated treatment well   Behavior During Therapy [facility] for tasks assessed/performed      Past Medical History:  Diagnosis Date  . Arthritis   . Breast cancer (HCC)    DCIS ER/PR+  . Bronchitis   . Family history of breast cancer   . Family history of colon cancer   . GERD (gastroesophageal reflux disease)   . Headache   . History of kidney stones   . Macromastia   . PONV (postoperative nausea and vomiting)    n/v with [phone]  . Rapid heart beat    takes metoprolol    Past Surgical History:  Procedure Laterality Date  . BACK SURGERY  1998  . BREAST LUMPECTOMY WITH RADIOACTIVE SEED AND SENTINEL LYMPH NODE BIOPSY Left 03/12/2017   Procedure: LEFT BREAST LUMPECTOMY WITH BRACKETED  RADIOACTIVE SEED AND SENTINEL LYMPH NODE BIOPSY;  Surgeon: Rolm Bookbinder, MD;  Location: [place];  Service: General;  Laterality: Left;  . BREAST REDUCTION SURGERY Bilateral 03/20/2017   Procedure: BILATERAL ONCOPLASTIC BREAST REDUCTION;  Surgeon: Irene Limbo, MD;  Location: [place];  Service: Plastics;  Laterality: Bilateral;  . CHOLECYSTECTOMY  1998  . LIPOMA EXCISION Right 04/2016   shoulder blade area  . WISDOM TOOTH EXTRACTION      There were no vitals filed for this visit.      Subjective Assessment - 04/07/17 1025    Subjective Saw Dr. Iran Planas after last visit and she said it was fine for me to do therapy and that the incisions are  healing well.    Pertinent History Left breast cancer diagnosed 01/07/17. Went for needle biopsy and breast MRI.  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  Tumor was 9 cm. so had reduction on both sides by Dr. Iran Planas on 03/20/17.  Will have XRT later this spring.  Has a rapid heart breat so is on Toprol.  Reports her knees pop, left > right, and hurt on stairs (or hips hurt).  Has seen rhematologist who thought she had psoriatic arthritis and was on methotrexate for a while, but she stopped that.  This bothers mainly in her hands; hips and shoulders were helped with a new mattress and hands and hips helped with cleaner eating. Gets left leg pain (buttocks to left knee on lateral aspect) and it goes numb with prolonged standing or walking. Had ruptured disc with microdiscectomy in 1998 for right side symptoms, which was successful, but has occasional flareup.   Patient Stated Goals get post-op checkup   Currently in Pain? Yes   Pain Score 2    Pain Location Breast   Pain Orientation Left   Pain Descriptors / Indicators Dull   Pain Type Surgical pain   Pain Onset 1 to 4 weeks ago   Pain Frequency Intermittent   Aggravating Factors  just feel like it's taking  awhile to heal   Pain Relieving Factors rubbing the area                         [facility] Adult PT Treatment/Exercise - 04/07/17 0001      Shoulder Exercises: Supine   External Rotation AAROM;Right;5 reps  With dowel holding for 5 seconds   Flexion AAROM;Both;10 reps  With cane holding 5 seconds   ABduction AAROM;Right;5 reps  With dowel holding for 5 seconds     Shoulder Exercises: Pulleys   Flexion 2 minutes   ABduction 2 minutes     Shoulder Exercises: Therapy Ball   Flexion 10 reps   ABduction 10 reps   ABduction Limitations VC to decrease scapular compensations     Manual Therapy   Manual Therapy Passive ROM   Passive ROM To bil shoulders into  flexion, abduction, and er and on Lt into radiation position.                PT Education - 04/07/17 1043    Education provided Yes   Education Details Supine dowel AA/ROM exercises   Person(s) Educated Patient   Methods Explanation;Demonstration;Handout   Comprehension Verbalized understanding;Returned demonstration                [facility] Goals - 04/01/17 1254      CC Long Term Goal  #1   Title Pt. will be independent in HEP for both shoulder ROM.   Time 4   Period Weeks   Status New     CC Long Term Goal  #2   Title Both shoulder active abduction to at least 160 degrees for improved ADLs.   Time 4   Period Weeks   Status New     CC Long Term Goal  #3   Title Both shoulder active flexion to at least 150 degrees for improved overhead reach.   Time 4   Period Weeks   Status New     CC Long Term Goal  #4   Title Pain with movement of arms decreased at least 50%.   Time 4   Period Weeks   Status New            Plan - 04/07/17 1214    Clinical Impression Statement Pr tolerated first session of P/ROM stretching very well and did well with instruction of supine dowel exercises. 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 So after discussing with pt and seeing how she is minimally limited with her end P/ROM discussed possibility of pt keeping other visit this week and coming once towards end of next week for remeasure to see if she can come once/week if she is doing well enough with stretching at home.    Rehab Potential Good   Clinical Impairments Affecting Rehab Potential still with plastic surgery restrictions   PT Frequency 2x / week   PT Duration 2 weeks  to 4 weeks prn   PT Treatment/Interventions ADLs/Self Care Home Management;Therapeutic  exercise;Patient/family education;Manual techniques;Manual lymph drainage;Scar mobilization;Passive range of motion   PT Next Visit Plan Cont P/AA/A/ROM to both shoulders and review HEP, in next visit or 2 add supine scapular series.. If her ROM is progressing well with next weeks meaures pt would like to try once/week  if she needs to come longer.    PT Home Exercise Plan Supine dowel and roll ball up wall   Consulted and Agree with Plan of Care Patient      Patient will benefit from skilled therapeutic intervention in order to improve the following deficits and impairments:  Decreased range of motion, Impaired UE functional use, Pain  Visit Diagnosis: Stiffness of left shoulder, not elsewhere classified  Stiffness of right shoulder, not elsewhere classified  Acute pain of left shoulder     Problem List Patient Active Problem List   Diagnosis Date Noted  . Breast cancer of lower-inner quadrant of left female breast ([place]) 03/19/2017  . Genetic testing 01/30/2017  . Breast cancer ([place])   . Family history of breast cancer   . Family history of colon cancer     Otelia Limes, PTA 04/07/2017, 12:19 PM  [place], [name], [address] Phone: [phone]   Fax:  [phone]  Name: Anne Lawrence MRN: [id number] Date of Birth: 03-Jun-1966

## 2017-04-07 NOTE — Patient Instructions (Signed)

## 2017-04-08 ENCOUNTER — Encounter (HOSPITAL_COMMUNITY): Payer: Self-pay

## 2017-04-09 ENCOUNTER — Ambulatory Visit: Payer: PRIVATE HEALTH INSURANCE

## 2017-04-09 DIAGNOSIS — M25612 Stiffness of left shoulder, not elsewhere classified: Secondary | ICD-10-CM

## 2017-04-09 DIAGNOSIS — M25611 Stiffness of right shoulder, not elsewhere classified: Secondary | ICD-10-CM

## 2017-04-09 DIAGNOSIS — M25512 Pain in left shoulder: Secondary | ICD-10-CM

## 2017-04-09 NOTE — Therapy (Signed)
[place], [name], [address] Phone: [phone]   Fax:  [phone]  Physical Therapy Treatment  Patient Details  Name: Anne Lawrence MRN: [id number] Date of Birth: 11-19-1966 Referring Provider: Dr. Serita Grammes  Encounter Date: 04/09/2017      PT End of Session - 04/09/17 0935    Visit Number 3   Number of Visits 9   Date for PT Re-Evaluation 05/22/17   PT Start Time 0855   PT Stop Time 0935   PT Time Calculation (min) 40 min   Activity Tolerance Patient tolerated treatment well   Behavior During Therapy [facility] for tasks assessed/performed      Past Medical History:  Diagnosis Date  . Arthritis   . Breast cancer (HCC)    DCIS ER/PR+  . Bronchitis   . Family history of breast cancer   . Family history of colon cancer   . GERD (gastroesophageal reflux disease)   . Headache   . History of kidney stones   . Macromastia   . PONV (postoperative nausea and vomiting)    n/v with [phone]  . Rapid heart beat    takes metoprolol    Past Surgical History:  Procedure Laterality Date  . BACK SURGERY  1998  . BREAST LUMPECTOMY WITH RADIOACTIVE SEED AND SENTINEL LYMPH NODE BIOPSY Left 03/12/2017   Procedure: LEFT BREAST LUMPECTOMY WITH BRACKETED  RADIOACTIVE SEED AND SENTINEL LYMPH NODE BIOPSY;  Surgeon: Rolm Bookbinder, MD;  Location: [facility];  Service: General;  Laterality: Left;  . BREAST REDUCTION SURGERY Bilateral 03/20/2017   Procedure: BILATERAL ONCOPLASTIC BREAST REDUCTION;  Surgeon: Irene Limbo, MD;  Location: [facility];  Service: Plastics;  Laterality: Bilateral;  . CHOLECYSTECTOMY  1998  . LIPOMA EXCISION Right 04/2016   shoulder blade area  . WISDOM TOOTH EXTRACTION      There were no vitals filed for this visit.      Subjective Assessment - 04/09/17 0857    Subjective One of the areas I was watching that wasn't healing well opened a little more yesterday morning and started  bleeding some under my Rt breast. But it stopped after little while. Not sure if the stretching caused it or not. But I see Dr. Iran Planas after I leave here and will address it with her. Not having any pain this morning, just tenderness at my incision sites.    Pertinent History Left breast cancer diagnosed 01/07/17. Went for needle biopsy and breast MRI.  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  Tumor was 9 cm. so had reduction on both sides by Dr. Iran Planas on 03/20/17.  Will have XRT later this spring.  Has a rapid heart breat so is on Toprol.  Reports her knees pop, left > right, and hurt on stairs (or hips hurt).  Has seen rhematologist who thought she had psoriatic arthritis and was on methotrexate for a while, but she stopped that.  This bothers mainly in her hands; hips and shoulders were helped with a new mattress and hands and hips helped with cleaner eating. Gets left leg pain (buttocks to left knee on lateral aspect) and it goes numb with prolonged standing or walking. Had ruptured disc with microdiscectomy in 1998 for right side symptoms, which was successful, but has occasional flareup.   Patient Stated Goals get post-op checkup   Currently in Pain? No/denies  [facility] PT Assessment - 04/09/17 0001      AROM   Right Shoulder Flexion 133 Degrees   Right Shoulder ABduction 121 Degrees   Right Shoulder Internal Rotation 51 Degrees   Left Shoulder Flexion 136 Degrees   Left Shoulder ABduction 113 Degrees   Left Shoulder Internal Rotation 72 Degrees                     [facility] Adult PT Treatment/Exercise - 04/09/17 0001      Shoulder Exercises: Pulleys   Flexion 2 minutes   ABduction 2 minutes     Manual Therapy   Manual Therapy Passive ROM   Passive ROM To bil shoulders into flexion, abduction, and er and on Lt into radiation position, was careful on Rt due to new opening of incision under breast.                          [facility] Goals - 04/01/17 1254      CC Long Term Goal  #1   Title Pt. will be independent in HEP for both shoulder ROM.   Time 4   Period Weeks   Status New     CC Long Term Goal  #2   Title Both shoulder active abduction to at least 160 degrees for improved ADLs.   Time 4   Period Weeks   Status New     CC Long Term Goal  #3   Title Both shoulder active flexion to at least 150 degrees for improved overhead reach.   Time 4   Period Weeks   Status New     CC Long Term Goal  #4   Title Pain with movement of arms decreased at least 50%.   Time 4   Period Weeks   Status New            Plan - 04/09/17 0935    Clinical Impression Statement Pt had small new opening (she thinks its new, this therapist didn't see area before) where she was bleeding from yesterday. No bleeding today, does have small exudate on pad she is wearing in bra there. Was careful with stretching on this side to make sure pt felt no pulling htere throughout. She tolerated this well and her A/ROM has improved since last week. Pt sees Dr. Iran Planas today as well.    Rehab Potential Good   Clinical Impairments Affecting Rehab Potential still with plastic surgery restrictions   PT Frequency 2x / week   PT Duration 2 weeks  to 4 weeks prn   PT Treatment/Interventions ADLs/Self Care Home Management;Therapeutic exercise;Patient/family education;Manual techniques;Manual lymph drainage;Scar mobilization;Passive range of motion   PT Next Visit Plan Cont P/AA/A/ROM to both shoulders and review HEP,  add supine scapular series.    PT Home Exercise Plan Supine dowel and roll ball up wall   Consulted and Agree with Plan of Care Patient      Patient will benefit from skilled therapeutic intervention in order to improve the following deficits and impairments:  Decreased range of motion, Impaired UE functional use, Pain  Visit Diagnosis: Stiffness of left shoulder, not  elsewhere classified  Stiffness of right shoulder, not elsewhere classified  Acute pain of left shoulder     Problem List Patient Active Problem List   Diagnosis Date Noted  . Breast cancer of lower-inner quadrant of left female breast ([place]) 03/19/2017  . Genetic testing 01/30/2017  . Breast  cancer ([place])   . Family history of breast cancer   . Family history of colon cancer     Otelia Limes, PTA 04/09/2017, 9:38 AM  [place], [name], [address] Phone: [phone]   Fax:  [phone]  Name: Chaka Boyson MRN: [id number] Date of Birth: [birth year]

## 2017-04-14 DIAGNOSIS — Z800 Family history of malignant neoplasm of digestive organs: Secondary | ICD-10-CM

## 2017-04-14 DIAGNOSIS — Z803 Family history of malignant neoplasm of breast: Secondary | ICD-10-CM

## 2017-04-14 DIAGNOSIS — Z86000 Personal history of in-situ neoplasm of breast: Secondary | ICD-10-CM

## 2017-04-14 DIAGNOSIS — C50912 Malignant neoplasm of unspecified site of left female breast: Secondary | ICD-10-CM | POA: Diagnosis not present

## 2017-04-14 DIAGNOSIS — Z170 Estrogen receptor positive status [ER+]: Secondary | ICD-10-CM

## 2017-04-14 DIAGNOSIS — Z802 Family history of malignant neoplasm of other respiratory and intrathoracic organs: Secondary | ICD-10-CM

## 2017-04-14 DIAGNOSIS — Z806 Family history of leukemia: Secondary | ICD-10-CM

## 2017-04-14 DIAGNOSIS — D649 Anemia, unspecified: Secondary | ICD-10-CM | POA: Diagnosis not present

## 2017-04-16 ENCOUNTER — Ambulatory Visit: Payer: PRIVATE HEALTH INSURANCE

## 2017-04-16 DIAGNOSIS — M25612 Stiffness of left shoulder, not elsewhere classified: Secondary | ICD-10-CM | POA: Diagnosis not present

## 2017-04-16 DIAGNOSIS — M25611 Stiffness of right shoulder, not elsewhere classified: Secondary | ICD-10-CM

## 2017-04-16 DIAGNOSIS — M25512 Pain in left shoulder: Secondary | ICD-10-CM

## 2017-04-16 NOTE — Therapy (Signed)
[place], [name], [address] Phone: [phone]   Fax:  [phone]  Physical Therapy Treatment  Patient Details  Name: Anne Lawrence MRN: [id number] Date of Birth: 10-Feb-1966 Referring Provider: Dr. Serita Grammes  Encounter Date: 04/16/2017      PT End of Session - 04/16/17 0950    Visit Number 4   Number of Visits 9   Date for PT Re-Evaluation 05/22/17   PT Start Time 0853   PT Stop Time 0948   PT Time Calculation (min) 55 min   Activity Tolerance Patient tolerated treatment well   Behavior During Therapy [facility] for tasks assessed/performed      Past Medical History:  Diagnosis Date  . Arthritis   . Breast cancer (HCC)    DCIS ER/PR+  . Bronchitis   . Family history of breast cancer   . Family history of colon cancer   . GERD (gastroesophageal reflux disease)   . Headache   . History of kidney stones   . Macromastia   . PONV (postoperative nausea and vomiting)    n/v with [phone]  . Rapid heart beat    takes metoprolol    Past Surgical History:  Procedure Laterality Date  . BACK SURGERY  1998  . BREAST LUMPECTOMY WITH RADIOACTIVE SEED AND SENTINEL LYMPH NODE BIOPSY Left 03/12/2017   Procedure: LEFT BREAST LUMPECTOMY WITH BRACKETED  RADIOACTIVE SEED AND SENTINEL LYMPH NODE BIOPSY;  Surgeon: Rolm Bookbinder, MD;  Location: [place];  Service: General;  Laterality: Left;  . BREAST REDUCTION SURGERY Bilateral 03/20/2017   Procedure: BILATERAL ONCOPLASTIC BREAST REDUCTION;  Surgeon: Irene Limbo, MD;  Location: [place];  Service: Plastics;  Laterality: Bilateral;  . CHOLECYSTECTOMY  1998  . LIPOMA EXCISION Right 04/2016   shoulder blade area  . WISDOM TOOTH EXTRACTION      There were no vitals filed for this visit.      Subjective Assessment - 04/16/17 0956    Subjective Saw Dr. Iran Planas since I was here last. She says because my breasts were so large before my reduction that  is why they are taking so long to heal. Her and my radiation oncologist have agreed that I need to delay starting my radiation until I am fully healed. I the mean time Dr. Iran Planas gave me some stuff (cellulose?) to pack my wound with once a day and I see her office again Tues, then her on June 4. I feel like my ROM is doing really well and I'd like to be on hold until I see her again because this is a really far drive for me.    Pertinent History Left breast cancer diagnosed 01/07/17. Went for needle biopsy and breast MRI.  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  Tumor was 9 cm. so had reduction on both sides by Dr. Iran Planas on 03/20/17.  Will have XRT later this spring.  Has a rapid heart breat so is on Toprol.  Reports her knees pop, left > right, and hurt on stairs (or hips hurt).  Has seen rhematologist who thought she had psoriatic arthritis and was on methotrexate for a while, but she stopped that.  This bothers mainly in her hands; hips and shoulders were helped with a new mattress and hands and hips helped with cleaner eating. Gets left leg pain (buttocks to left knee on lateral aspect) and it goes numb with prolonged standing or  walking. Had ruptured disc with microdiscectomy in 1998 for right side symptoms, which was successful, but has occasional flareup.   Patient Stated Goals get post-op checkup   Currently in Pain? No/denies            [facility] PT Assessment - 04/16/17 0001      AROM   Right Shoulder Flexion 138 Degrees   Right Shoulder ABduction 139 Degrees   Right Shoulder Internal Rotation 74 Degrees   Left Shoulder Flexion 142 Degrees   Left Shoulder ABduction 130 Degrees                     [facility] Adult PT Treatment/Exercise - 04/16/17 0001      Shoulder Exercises: Supine   Horizontal ABduction AROM;Both;10 reps   External Rotation AROM;Both;10 reps  with arms at sides, then with hands behind head    Flexion AROM;Both;5 reps   Other Supine Exercises Bil D2 5 times each      Shoulder Exercises: Therapy Ball   Flexion 10 reps  With forward lean into end of stretch   ABduction 5 reps  Bil UE with side lean into end of stretch     Manual Therapy   Manual Therapy Passive ROM   Passive ROM To bil shoulders into flexion and abduction briefly at end of session                PT Education - 04/16/17 0949    Education provided Yes   Education Details Supine scapular series without theraband for now as wounds are slow healing and have reopened.    Person(s) Educated Patient   Methods Explanation;Demonstration;Handout   Comprehension Verbalized understanding;Returned demonstration                Long Term Clinic Goals - 04/16/17 1010      CC Long Term Goal  #1   Title Pt. will be independent in HEP for both shoulder ROM.   Baseline Pt is I with HEP for ROM and feels she is using arms as much as able now with restrictions still from surgery-04/16/17   Status Achieved     CC Long Term Goal  #2   Title Both shoulder active abduction to at least 160 degrees for improved ADLs.   Baseline Rt 142 and Lt 130degrees-04/16/17   Status On-going     CC Long Term Goal  #3   Title Both shoulder active flexion to at least 150 degrees for improved overhead reach.   Baseline Rt 138 and Lt 139 degrees-04/16/17   Status On-going     CC Long Term Goal  #4   Title Pain with movement of arms decreased at least 50%.   Baseline Pt did not rate but does not report any c/o pain in shoulders at this time with ADLs-04/16/17   Status Achieved            Plan - 04/16/17 0950    Clinical Impression Statement Pt now with opening that she needs to pack at bil inferior breast, so limited exercise today to ROM only with no resistance. She did well with instruction of supine scapular series wihtout resistance. Pt on hold until she sees Dr. Iran Planas as her ROM has progressed well and we  aren't going to progress to strengthening exercises until cleared by Dr. Iran Planas. Pt to call us and let us know when Dr. Iran Planas lifts restrictions/wounds are healing.   Rehab Potential Good   Clinical Impairments Affecting Rehab  Potential still with plastic surgery restrictions   PT Frequency 2x / week   PT Duration 2 weeks  to 4 weeks prn   PT Treatment/Interventions ADLs/Self Care Home Management;Therapeutic exercise;Patient/family education;Manual techniques;Manual lymph drainage;Scar mobilization;Passive range of motion   PT Next Visit Plan Pt on hold until she sees Dr. Iran Planas (pt will cont HEP stretching) and is cleared for strengthening progression, but will call us if she feels her ROM is decreasing.    PT Home Exercise Plan Supine dowel and roll ball up wall; supine scapular series without resistance.    Consulted and Agree with Plan of Care Patient      Patient will benefit from skilled therapeutic intervention in order to improve the following deficits and impairments:  Decreased range of motion, Impaired UE functional use, Pain  Visit Diagnosis: Stiffness of left shoulder, not elsewhere classified  Stiffness of right shoulder, not elsewhere classified  Acute pain of left shoulder     Problem List Patient Active Problem List   Diagnosis Date Noted  . Breast cancer of lower-inner quadrant of left female breast ([place]) 03/19/2017  . Genetic testing 01/30/2017  . Breast cancer ([place])   . Family history of breast cancer   . Family history of colon cancer     Otelia Limes, PTA 04/16/2017, 10:13 AM  [place], [name], [address] Phone: [phone]   Fax:  [phone]  Name: Anne Lawrence MRN: [id number] Date of Birth: Aug 17, 1966

## 2017-04-16 NOTE — Patient Instructions (Signed)
Over Head Pull: Narrow and Wide Grip   Cancer Rehab [phone]   On back, knees bent, feet flat, band across thighs, elbows straight but relaxed. Pull hands apart (start). Keeping elbows straight, bring arms up and over head, hands toward floor. Keep pull steady on band. Hold momentarily. Return slowly, keeping pull steady, back to start. Then do same with a wider grip on the band (past shoulder width) Repeat _5-10__ times. Band color __yellow____   Side Pull: Double Arm   On back, knees bent, feet flat. Arms perpendicular to body, shoulder level, elbows straight but relaxed. Pull arms out to sides, elbows straight. Resistance band comes across collarbones, hands toward floor. Hold momentarily. Slowly return to starting position. Repeat _5-10__ times. Band color _yellow____   Sword   On back, knees bent, feet flat, left hand on left hip, right hand above left. Pull right arm DIAGONALLY (hip to shoulder) across chest. Bring right arm along head toward floor. Hold momentarily. Slowly return to starting position. Repeat _5-10__ times. Do with left arm. Band color _yellow_____   Shoulder Rotation: Double Arm   On back, knees bent, feet flat, elbows tucked at sides, bent 90, hands palms up. Pull hands apart and down toward floor, keeping elbows near sides. Hold momentarily. Slowly return to starting position. Repeat _5-10__ times. Band color __yellow____   Can also do these in standing leaning against wall: Walk feet away from wall a few inches, have shoulders/head against wall and back flat against wall (tuck hips underneath you by sucking tummy in)

## 2017-06-30 ENCOUNTER — Encounter (HOSPITAL_COMMUNITY): Payer: Self-pay

## 2017-07-06 DIAGNOSIS — D649 Anemia, unspecified: Secondary | ICD-10-CM | POA: Diagnosis not present

## 2017-07-06 DIAGNOSIS — E538 Deficiency of other specified B group vitamins: Secondary | ICD-10-CM | POA: Diagnosis not present

## 2017-07-06 DIAGNOSIS — Z803 Family history of malignant neoplasm of breast: Secondary | ICD-10-CM | POA: Diagnosis not present

## 2017-07-06 DIAGNOSIS — D170 Benign lipomatous neoplasm of skin and subcutaneous tissue of head, face and neck: Secondary | ICD-10-CM | POA: Diagnosis not present

## 2017-07-06 DIAGNOSIS — Z86000 Personal history of in-situ neoplasm of breast: Secondary | ICD-10-CM | POA: Diagnosis not present

## 2017-07-06 DIAGNOSIS — R945 Abnormal results of liver function studies: Secondary | ICD-10-CM | POA: Diagnosis not present

## 2017-07-06 DIAGNOSIS — C50912 Malignant neoplasm of unspecified site of left female breast: Secondary | ICD-10-CM | POA: Diagnosis not present

## 2017-07-06 DIAGNOSIS — E611 Iron deficiency: Secondary | ICD-10-CM | POA: Diagnosis not present

## 2017-07-14 ENCOUNTER — Other Ambulatory Visit: Payer: Self-pay | Admitting: Oncology

## 2017-07-14 DIAGNOSIS — R932 Abnormal findings on diagnostic imaging of liver and biliary tract: Secondary | ICD-10-CM

## 2017-07-14 DIAGNOSIS — C50812 Malignant neoplasm of overlapping sites of left female breast: Secondary | ICD-10-CM

## 2017-07-29 ENCOUNTER — Ambulatory Visit
Admission: RE | Admit: 2017-07-29 | Discharge: 2017-07-29 | Disposition: A | Discharge status: Home / Self Care | Payer: PRIVATE HEALTH INSURANCE | Source: Ambulatory Visit | Attending: Oncology | Admitting: Oncology

## 2017-07-29 DIAGNOSIS — R932 Abnormal findings on diagnostic imaging of liver and biliary tract: Secondary | ICD-10-CM

## 2017-07-29 DIAGNOSIS — C50812 Malignant neoplasm of overlapping sites of left female breast: Secondary | ICD-10-CM

## 2017-07-29 MED ORDER — GADOBENATE DIMEGLUMINE 529 MG/ML IV SOLN
20.0000 mL | Freq: Once | INTRAVENOUS | Status: AC | PRN
  Administered 2017-07-29: 20 mL via INTRAVENOUS

## 2017-08-21 DIAGNOSIS — Z923 Personal history of irradiation: Secondary | ICD-10-CM

## 2017-08-21 DIAGNOSIS — C50912 Malignant neoplasm of unspecified site of left female breast: Secondary | ICD-10-CM

## 2017-08-21 DIAGNOSIS — E611 Iron deficiency: Secondary | ICD-10-CM

## 2017-08-21 DIAGNOSIS — Z79811 Long term (current) use of aromatase inhibitors: Secondary | ICD-10-CM

## 2017-08-21 DIAGNOSIS — R945 Abnormal results of liver function studies: Secondary | ICD-10-CM

## 2017-08-21 DIAGNOSIS — D170 Benign lipomatous neoplasm of skin and subcutaneous tissue of head, face and neck: Secondary | ICD-10-CM

## 2017-08-21 DIAGNOSIS — Z86000 Personal history of in-situ neoplasm of breast: Secondary | ICD-10-CM

## 2017-08-21 DIAGNOSIS — E538 Deficiency of other specified B group vitamins: Secondary | ICD-10-CM

## 2017-08-21 DIAGNOSIS — Z809 Family history of malignant neoplasm, unspecified: Secondary | ICD-10-CM

## 2017-10-13 ENCOUNTER — Ambulatory Visit: Payer: PRIVATE HEALTH INSURANCE | Attending: Plastic Surgery | Admitting: Physical Therapy

## 2017-10-13 DIAGNOSIS — M25512 Pain in left shoulder: Secondary | ICD-10-CM | POA: Diagnosis present

## 2017-10-13 DIAGNOSIS — R600 Localized edema: Secondary | ICD-10-CM | POA: Diagnosis present

## 2017-10-13 NOTE — Therapy (Addendum)
[place], [name], [address] Phone: [phone]   Fax:  [phone]  Physical Therapy Evaluation  Patient Details  Name: Anne Lawrence MRN: [id number] Date of Birth: 04-May-1966 Referring Provider: Dr. Irene Limbo   Encounter Date: 10/13/2017  PT End of Session - 10/13/17 1716    Visit Number  1    Number of Visits  4    Date for PT Re-Evaluation  11/16/17    PT Start Time  0945    PT Stop Time  1026    PT Time Calculation (min)  41 min    Activity Tolerance  Patient tolerated treatment well    Behavior During Therapy  [facility] for tasks assessed/performed       Past Medical History:  Diagnosis Date  . Arthritis   . Breast cancer (HCC)    DCIS ER/PR+  . Bronchitis   . Family history of breast cancer   . Family history of colon cancer   . GERD (gastroesophageal reflux disease)   . Headache   . History of kidney stones   . Macromastia   . PONV (postoperative nausea and vomiting)    n/v with [phone]  . Rapid heart beat    takes metoprolol    Past Surgical History:  Procedure Laterality Date  . BACK SURGERY  1998  . BREAST LUMPECTOMY WITH RADIOACTIVE SEED AND SENTINEL LYMPH NODE BIOPSY Left 03/12/2017   Procedure: LEFT BREAST LUMPECTOMY WITH BRACKETED  RADIOACTIVE SEED AND SENTINEL LYMPH NODE BIOPSY;  Surgeon: Rolm Bookbinder, MD;  Location: [place];  Service: General;  Laterality: Left;  . BREAST REDUCTION SURGERY Bilateral 03/20/2017   Procedure: BILATERAL ONCOPLASTIC BREAST REDUCTION;  Surgeon: Irene Limbo, MD;  Location: [place];  Service: Plastics;  Laterality: Bilateral;  . CHOLECYSTECTOMY  1998  . LIPOMA EXCISION Right 04/2016   shoulder blade area  . WISDOM TOOTH EXTRACTION      There were no vitals filed for this visit.   Subjective Assessment - 10/13/17 0949    Subjective  "I finished my radiation and I had my follow-up.  Dr. Iran Planas wnted to see me back because she  did my reduction.  I told her that the left breast is still swelled and I've got a lot of soreness in it. Every once in a while I get sharp pains in it.  Dr. Iran Planas could see the swelling.  Up under my armpit it's still sore."    Pertinent History  Left breast cancer diagnosed 01/07/17. Went for needle biopsy and breast MRI.  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  Tumor was 9 cm. so had reduction on both sides by Dr. Iran Planas on 03/20/17.  Finished XRT 07/06/17 and is now on anastrozole.  Has a rapid heart breat so is on Toprol.  Reports her knees pop, left > right, and hurt on stairs (or hips hurt).  Has seen rheumatologist who thought she had psoriatic arthritis and was on methotrexate for a while, but she stopped that.  This bothers mainly in her hands; hips and shoulders were helped with a new mattress and hands and hips helped with cleaner eating. Gets left leg pain (buttocks to left knee on lateral aspect) and it goes numb with prolonged standing or walking. Had ruptured disc with microdiscectomy in 1998 for right side symptoms, which was successful, but has occasional flareup. Right hand goes numb at times and whole arm  goes numb if she lies on her right side at night. Having some right foot pain.  Right foot supinates. Also says she has some left heelspur trouble.    Patient Stated Goals  Make sure the left breast swelling isn't lymphedema; find out about my soreness, pain and swelling in my breast.    Currently in Pain?  Yes    Pain Score  0-No pain up to 3/10, for a split second    Pain Location  Breast and axilla    Pain Orientation  Left    Pain Descriptors / Indicators  Sharp    Pain Frequency  Intermittent    Aggravating Factors   can't tell what sets it off    Pain Relieving Factors  hasn't tried anything--just waits and it dissipates         [facility] PT Assessment - 10/13/17 0001      Assessment   Medical Diagnosis   left breast cancer with swelling    Referring Provider  Dr. Irene Limbo    Onset Date/Surgical Date  03/20/17 for breast reduction; lumpectomy was 03/12/17    Hand Dominance  Right    Prior Therapy  was here in May and June for four visits      Precautions   Precautions  Other (comment)    Precaution Comments  cancer precautions      Restrictions   Weight Bearing Restrictions  No      Balance Screen   Has the patient fallen in the past 6 months  No    Has the patient had a decrease in activity level because of a fear of falling?   No    Is the patient reluctant to leave their home because of a fear of falling?   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 paper      Cognition   Overall Cognitive Status  Within Functional Limits for tasks assessed      Observation/Other Assessments   Other Surveys   -- lymphedema life impact scale score of 9 = 13% impairment      AROM   Overall AROM Comments  both shoulders grossly WFL; she feels pulling in left pect area with shoulder er      Palpation   Palpation comment  left breast tissue feels firmer than right        LYMPHEDEMA/ONCOLOGY QUESTIONNAIRE - 10/13/17 1018      Treatment   Past Radiation Treatment  Yes    Date  07/06/17    Body Site  left breast      What other symptoms do you have   Are you Having Heaviness or Tightness  Yes    Are you having Pain  Yes      Left Upper Extremity Lymphedema   Other  see photo for breast swelling             Objective measurements completed on examination: See above findings.              PT  Education - 10/13/17 1715    Education provided  Yes    Education Details  about compression bra features and fit for best results    Person(s) Educated  Patient    Methods  Explanation;Handout photocopy of [place] bra types    Comprehension  Verbalized understanding             [facility] Goals - 10/13/17 1725      CC Long Term Goal  #1   Title  Pt. will be knowledgeable about where and how to obtain a compression bra that fits properly and provides appropriate compression.    Time  3    Period  Days    Status  New      CC Long Term Goal  #2   Title  Pt. will be independent in performing self-manual lymph drainage.    Time  3    Period  Weeks    Status  New      CC Long Term Goal  #3   Title  Pt. will report at least 35% decrease in discomfort that she experiences in the left breast/axilla area.    Time  3    Period  Weeks    Status  New          Plan - 10/13/17 1717    Clinical Impression Statement  This is a pleasant woman known to this clinic from a previous short course of treatment, a course cut short by her having some drainage from a wound.  She subsequently was doing well with her ROM so did not return.  She comes in now having completed XRT for left breast cancer (s/p lumpectomy with SLNB and breast reduction).  She has discomfort in her left breast and swelling there.  Breast had a different appearance and tissue texture on left compared to right; measurements were not attempted by see attached picture for illustration of this difference.  We discussed the need for a compression bra and the features she should look for, and she plans to go to [facility] [place] about that.  She will benefit from another short course of therapy to learn self-manual lymph drainage for the left breast, regardless of whether this is actual lymphedema or swelling from another cause.    Clinical Decision Making  -- This is a re-evaluation.    Rehab Potential  Good    PT Frequency   1x / week    PT Duration  3 weeks    PT Treatment/Interventions  ADLs/Self Care Home Management;DME Instruction;Therapeutic exercise;Patient/family education;Orthotic Fit/Training;Manual techniques;Manual lymph drainage;Compression bandaging    PT Next Visit Plan  Begin instruction in self-manual lymph drainage for left breast.  Check to see if she has been fittted for compression bras.    Recommended Other Services  Go to [facility] [employer] re: compression bras    Consulted and Agree with Plan of Care  Patient       Patient will benefit from skilled therapeutic intervention in order to improve the following deficits and impairments:  Increased edema, Pain, Decreased knowledge of use of DME, Decreased knowledge of precautions  Visit Diagnosis: Localized edema - Plan: PT plan of care cert/re-cert  Left shoulder pain, unspecified chronicity - Plan: PT plan of care cert/re-cert     Problem List Patient Active Problem List   Diagnosis Date Noted  . Breast cancer of lower-inner quadrant of left female breast ([place]) 03/19/2017  . Genetic testing 01/30/2017  . Breast cancer (  [place])   . Family history of breast cancer   . Family history of colon cancer     SALISBURY,DONNA 10/13/2017, 5:28 PM  [place], [name], [address] Phone: [phone]   Fax:  [phone]  Name: Gurleen Larrivee MRN: [id number] Date of Birth: May 30, 1966  Serafina Royals, PT 10/13/17 5:28 PM

## 2017-10-28 ENCOUNTER — Ambulatory Visit: Payer: PRIVATE HEALTH INSURANCE | Attending: Plastic Surgery

## 2017-10-28 DIAGNOSIS — M25612 Stiffness of left shoulder, not elsewhere classified: Secondary | ICD-10-CM | POA: Diagnosis present

## 2017-10-28 DIAGNOSIS — R600 Localized edema: Secondary | ICD-10-CM | POA: Insufficient documentation

## 2017-10-28 DIAGNOSIS — M25512 Pain in left shoulder: Secondary | ICD-10-CM | POA: Diagnosis present

## 2017-10-28 NOTE — Patient Instructions (Addendum)
Self manual lymph drainage: Perform this sequence once a day.  Only give enough pressure no your skin to make the skin move.  Start with circles near neck above collarbones 10 times.  Diaphragmatic - Supine   Inhale through nose making navel move out toward hands. Exhale through puckered lips, hands follow navel in. Repeat _5__ times. Rest _10__ seconds between repeats.   Copyright  VHI. All rights reserved.  Hug yourself.  Do circles at your neck just above your collarbones.  Repeat this 10 times.  Axilla - One at a Time   Using full weight of flat hand and fingers at center of uninvolved armpit, make _10__ in-place circles.   Copyright  VHI. All rights reserved.  LEG: Inguinal Nodes Stimulation   With small finger side of hand against hip crease on involved side, gently perform circles at the crease. Repeat __10_ times.   Copyright  VHI. All rights reserved.  1) Axilla to Inguinal Nodes - Sweep   On involved side, sweep _4__ times from armpit along side of trunk to hip crease.  Now gently stretch skin from the involved side to the uninvolved side across the chest at the shoulder line.  Repeat that 4 times.  Draw an imaginary diagonal line from upper outer breast through the nipple area toward lower inner breast.  Direct fluid upward and inward from this line toward the pathway across your upper chest .  Do this in three rows to treat all of the upper inner breast tissue, and do each row 3-4x.      Direct fluid to treat all of lower outer breast tissue downward and outward toward  pathway that is aimed at the left groin.  Finish by doing the pathways as described above going from your involved armpit to the same side groin and going across your upper chest from the involved shoulder to the uninvolved shoulder.  Repeat the steps above where you do circles in your left groin and right armpit. Copyright  VHI. All rights reserved.

## 2017-10-28 NOTE — Therapy (Signed)
[place], [name], [address] Phone: [phone]   Fax:  [phone]  Physical Therapy Treatment  Patient Details  Name: Anne Lawrence MRN: [id number] Date of Birth: 09/08/1966 Referring Provider: Dr. Irene Lawrence   Encounter Date: 10/28/2017  PT End of Session - 10/28/17 1016    Visit Number  2    Number of Visits  4    Date for PT Re-Evaluation  11/16/17    PT Start Time  0935    PT Stop Time  1017    PT Time Calculation (min)  42 min    Activity Tolerance  Patient tolerated treatment well    Behavior During Therapy  [facility] for tasks assessed/performed       Past Medical History:  Diagnosis Date  . Arthritis   . Breast cancer (HCC)    DCIS ER/PR+  . Bronchitis   . Family history of breast cancer   . Family history of colon cancer   . GERD (gastroesophageal reflux disease)   . Headache   . History of kidney stones   . Macromastia   . PONV (postoperative nausea and vomiting)    n/v with [phone]  . Rapid heart beat    takes metoprolol    Past Surgical History:  Procedure Laterality Date  . BACK SURGERY  1998  . BREAST LUMPECTOMY WITH RADIOACTIVE SEED AND SENTINEL LYMPH NODE BIOPSY Left 03/12/2017   Procedure: LEFT BREAST LUMPECTOMY WITH BRACKETED  RADIOACTIVE SEED AND SENTINEL LYMPH NODE BIOPSY;  Surgeon: Anne Bookbinder, MD;  Location: [place];  Service: General;  Laterality: Left;  . BREAST REDUCTION SURGERY Bilateral 03/20/2017   Procedure: BILATERAL ONCOPLASTIC BREAST REDUCTION;  Surgeon: Anne Limbo, MD;  Location: [place];  Service: Plastics;  Laterality: Bilateral;  . CHOLECYSTECTOMY  1998  . LIPOMA EXCISION Right 04/2016   shoulder blade area  . WISDOM TOOTH EXTRACTION      There were no vitals filed for this visit.  Subjective Assessment - 10/28/17 0941    Subjective  My Lt axillary pain has been soe improved since I was here last. I think the swelling is some  improved and I've been trying to wear my most copressive bras. I haven't called [facility] yet but plan to soon.    Pertinent History  Left breast cancer diagnosed 01/07/17. Went for needle biopsy and breast MRI.  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  Tumor was 9 cm. so had reduction on both sides by Dr. Iran Lawrence on 03/20/17.  Finished XRT 07/06/17 and is now on anastrozole.  Has a rapid heart breat so is on Toprol.  Reports her knees pop, left > right, and hurt on stairs (or hips hurt).  Has seen rheumatologist who thought she had psoriatic arthritis and was on methotrexate for a while, but she stopped that.  This bothers mainly in her hands; hips and shoulders were helped with a new mattress and hands and hips helped with cleaner eating. Gets left leg pain (buttocks to left knee on lateral aspect) and it goes numb with prolonged standing or walking. Had ruptured disc with microdiscectomy in 1998 for right side symptoms, which was successful, but has occasional flareup. Right hand goes numb at times and whole arm goes numb if she lies on her right side at night. Having some right foot pain.  Right foot supinates. Also says she has some left heelspur trouble.  Patient Stated Goals  Make sure the left breast swelling isn't lymphedema; find out about my soreness, pain and swelling in my breast.    Currently in Pain?  No/denies                      [facility] Adult PT Treatment/Exercise - 10/28/17 0001      Manual Therapy   Manual Therapy  Manual Lymphatic Drainage (MLD)    Manual Lymphatic Drainage (MLD)  In Supine: Short neck, superficial and deep abdominals; Rt axilla and Lt inguinal nodes, then anterior inter-axillary and Lt axillo-inguinal anastomosis and then focused on Lt breast instructing pt in this throughout.              PT Education - 10/28/17 0945    Education provided  Yes    Education Details  Self  MLD to Lt breast and basics of anatomy of lymphatic system    Person(s) Educated  Patient    Methods  Explanation;Demonstration;Handout;Tactile cues    Comprehension  Verbalized understanding;Returned demonstration;Need further instruction             [facility] Goals - 10/13/17 1725      CC Long Term Goal  #1   Title  Pt. will be knowledgeable about where and how to obtain a compression bra that fits properly and provides appropriate compression.    Time  3    Period  Days    Status  New      CC Long Term Goal  #2   Title  Pt. will be independent in performing self-manual lymph drainage.    Time  3    Period  Weeks    Status  New      CC Long Term Goal  #3   Title  Pt. will report at least 35% decrease in discomfort that she experiences in the left breast/axilla area.    Time  3    Period  Weeks    Status  New         Plan - 10/28/17 1017    Clinical Impression Statement  Instructed pt in self manual lymph drainage which she did very well with today. Able to return good demonstration requiring cuing for less pressure, but was showing good skin stretch. Pt has not called for a new compression bra yet but plans to do this soon. Issued handout for self MLD for pt to practice before her next visit in a week.     Rehab Potential  Good    PT Frequency  1x / week    PT Duration  3 weeks    PT Treatment/Interventions  ADLs/Self Care Home Management;DME Instruction;Therapeutic exercise;Patient/family education;Orthotic Fit/Training;Manual techniques;Manual lymph drainage;Compression bandaging    PT Next Visit Plan  Cont and review self-manual lymph drainage for left breast.  Check to see if she has been fittted for compression bras.    Consulted and Agree with Plan of Care  Patient       Patient will benefit from skilled therapeutic intervention in order to improve the following deficits and impairments:  Increased edema, Pain, Decreased knowledge of use of DME,  Decreased knowledge of precautions  Visit Diagnosis: Localized edema     Problem List Patient Active Problem List   Diagnosis Date Noted  . Breast cancer of lower-inner quadrant of left female breast ([place]) 03/19/2017  . Genetic testing 01/30/2017  . Breast cancer ([place])   . Family history of breast cancer   .  Family history of colon cancer     Otelia Limes, PTA 10/28/2017, 10:39 AM  [place], [name], [address] Phone: [phone]   Fax:  [phone]  Name: Anne Lawrence MRN: [id number] Date of Birth: 1966-06-22

## 2017-11-04 ENCOUNTER — Ambulatory Visit: Payer: PRIVATE HEALTH INSURANCE

## 2017-11-04 DIAGNOSIS — R600 Localized edema: Secondary | ICD-10-CM

## 2017-11-04 DIAGNOSIS — M25512 Pain in left shoulder: Secondary | ICD-10-CM

## 2017-11-04 DIAGNOSIS — M25612 Stiffness of left shoulder, not elsewhere classified: Secondary | ICD-10-CM

## 2017-11-04 NOTE — Therapy (Signed)
[name], [name], [address] Phone: [phone]   Fax:  [phone]  Physical Therapy Treatment  Patient Details  Name: Anne Lawrence MRN: [id number] Date of Birth: 1966-04-06 Referring Provider: Dr. Irene Limbo   Encounter Date: 11/04/2017  PT End of Session - 11/04/17 1520    Visit Number  3    Number of Visits  4    Date for PT Re-Evaluation  11/16/17    PT Start Time  1435    PT Stop Time  1520    PT Time Calculation (min)  45 min    Activity Tolerance  Patient tolerated treatment well    Behavior During Therapy  [facility] for tasks assessed/performed       Past Medical History:  Diagnosis Date  . Arthritis   . Breast cancer (HCC)    DCIS ER/PR+  . Bronchitis   . Family history of breast cancer   . Family history of colon cancer   . GERD (gastroesophageal reflux disease)   . Headache   . History of kidney stones   . Macromastia   . PONV (postoperative nausea and vomiting)    n/v with [phone]  . Rapid heart beat    takes metoprolol    Past Surgical History:  Procedure Laterality Date  . BACK SURGERY  1998  . BREAST LUMPECTOMY WITH RADIOACTIVE SEED AND SENTINEL LYMPH NODE BIOPSY Left 03/12/2017   Procedure: LEFT BREAST LUMPECTOMY WITH BRACKETED  RADIOACTIVE SEED AND SENTINEL LYMPH NODE BIOPSY;  Surgeon: Rolm Bookbinder, MD;  Location: [place];  Service: General;  Laterality: Left;  . BREAST REDUCTION SURGERY Bilateral 03/20/2017   Procedure: BILATERAL ONCOPLASTIC BREAST REDUCTION;  Surgeon: Irene Limbo, MD;  Location: [place];  Service: Plastics;  Laterality: Bilateral;  . CHOLECYSTECTOMY  1998  . LIPOMA EXCISION Right 04/2016   shoulder blade area  . WISDOM TOOTH EXTRACTION      There were no vitals filed for this visit.  Subjective Assessment - 11/04/17 1446    Subjective  My Lt breast has really been hurting since I was here last. It felt better after my appointment but  since it's really been hurting from my axilla into my breast. I brought my husband so he can learn the massage too.  I see my oncologist about it tomorrow.     Pertinent History  Left breast cancer diagnosed 01/07/17. Went for needle biopsy and breast MRI.  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  Tumor was 9 cm. so had reduction on both sides by Dr. Iran Planas on 03/20/17.  Finished XRT 07/06/17 and is now on anastrozole.  Has a rapid heart breat so is on Toprol.  Reports her knees pop, left > right, and hurt on stairs (or hips hurt).  Has seen rheumatologist who thought she had psoriatic arthritis and was on methotrexate for a while, but she stopped that.  This bothers mainly in her hands; hips and shoulders were helped with a new mattress and hands and hips helped with cleaner eating. Gets left leg pain (buttocks to left knee on lateral aspect) and it goes numb with prolonged standing or walking. Had ruptured disc with microdiscectomy in 1998 for right side symptoms, which was successful, but has occasional flareup. Right hand goes numb at times and whole arm goes numb if she lies on her right side at night. Having some right foot pain.  Right foot  supinates. Also says she has some left heelspur trouble.    Patient Stated Goals  Make sure the left breast swelling isn't lymphedema; find out about my soreness, pain and swelling in my breast.    Currently in Pain?  Yes    Pain Score  5     Pain Location  Breast    Pain Orientation  Left    Pain Descriptors / Indicators  Stabbing    Pain Type  Acute pain    Aggravating Factors   not sure    Pain Relieving Factors  MLD helps some                      [facility] Adult PT Treatment/Exercise - 11/04/17 0001      Manual Therapy   Manual Therapy  Manual Lymphatic Drainage (MLD);Passive ROM    Manual Lymphatic Drainage (MLD)  In Supine: Short neck, superficial and deep abdominals; Rt  axilla and Lt inguinal nodes, then anterior inter-axillary and Lt axillo-inguinal anastomosis and then focused on Lt breast instructing husband in this throughout, also in Rt S/L for posterior inter-axillary and Lt axillo-inguinal anastomosis.    Passive ROM  Brief P/ROM to Lt shoulder into abduction                      [facility] Goals - 10/13/17 1725      CC Long Term Goal  #1   Title  Pt. will be knowledgeable about where and how to obtain a compression bra that fits properly and provides appropriate compression.    Time  3    Period  Days    Status  New      CC Long Term Goal  #2   Title  Pt. will be independent in performing self-manual lymph drainage.    Time  3    Period  Weeks    Status  New      CC Long Term Goal  #3   Title  Pt. will report at least 35% decrease in discomfort that she experiences in the left breast/axilla area.    Time  3    Period  Weeks    Status  New         Plan - 11/04/17 1521    Clinical Impression Statement  Reviewed with pt and instructed husband in manual lymph drainage having him perform as well. Instructed by demonstration and with hand over hand technique. He was able to return correct demo of sequence and good skin stretch, just will continue to work on lighter pressure. Pt did reports her breast/axilla pain some improved by end of session, decreasing to a 3/10 from an intial 5/10. Skin/tissue does not appear to have active infection, no signs of redness or warmth to the area and pt not overly tender to touch. Has not called [facility] [place] yet but plans to.    Rehab Potential  Good    PT Frequency  1x / week    PT Duration  3 weeks    PT Treatment/Interventions  ADLs/Self Care Home Management;DME Instruction;Therapeutic exercise;Patient/family education;Orthotic Fit/Training;Manual techniques;Manual lymph drainage;Compression bandaging    PT Next Visit Plan  Cont and review self-manual lymph drainage for left breast.   Check to see if she has been fittted for compression bras.    Consulted and Agree with Plan of Care  Patient       Patient will benefit from skilled therapeutic intervention in  order to improve the following deficits and impairments:  Increased edema, Pain, Decreased knowledge of use of DME, Decreased knowledge of precautions  Visit Diagnosis: Localized edema  Left shoulder pain, unspecified chronicity  Stiffness of left shoulder, not elsewhere classified     Problem List Patient Active Problem List   Diagnosis Date Noted  . Breast cancer of lower-inner quadrant of left female breast ([place]) 03/19/2017  . Genetic testing 01/30/2017  . Breast cancer ([place])   . Family history of breast cancer   . Family history of colon cancer     Otelia Limes, PTA 11/04/2017, 3:28 PM  [place], [name], [address] Phone: [phone]   Fax:  [phone]  Name: Damon Baisch MRN: [id number] Date of Birth: 1966/06/13

## 2017-11-05 DIAGNOSIS — C50912 Malignant neoplasm of unspecified site of left female breast: Secondary | ICD-10-CM

## 2017-11-05 DIAGNOSIS — Z79810 Long term (current) use of selective estrogen receptor modulators (SERMs): Secondary | ICD-10-CM

## 2017-11-05 DIAGNOSIS — M25512 Pain in left shoulder: Secondary | ICD-10-CM

## 2017-11-12 ENCOUNTER — Ambulatory Visit: Payer: PRIVATE HEALTH INSURANCE

## 2017-11-12 DIAGNOSIS — R600 Localized edema: Secondary | ICD-10-CM | POA: Diagnosis not present

## 2017-11-12 NOTE — Therapy (Signed)
[place], [name], [address] Phone: [phone]   Fax:  [phone]  Physical Therapy Treatment  Patient Details  Name: Anne Lawrence MRN: [id number] Date of Birth: 1966/06/29 Referring Provider: Dr. Irene Limbo   Encounter Date: 11/12/2017  PT End of Session - 11/12/17 1217    Visit Number  4    Number of Visits  4    Date for PT Re-Evaluation  11/16/17    PT Start Time  1024    PT Stop Time  1105    PT Time Calculation (min)  41 min    Activity Tolerance  Patient tolerated treatment well    Behavior During Therapy  [facility] for tasks assessed/performed       Past Medical History:  Diagnosis Date  . Arthritis   . Breast cancer (HCC)    DCIS ER/PR+  . Bronchitis   . Family history of breast cancer   . Family history of colon cancer   . GERD (gastroesophageal reflux disease)   . Headache   . History of kidney stones   . Macromastia   . PONV (postoperative nausea and vomiting)    n/v with [phone]  . Rapid heart beat    takes metoprolol    Past Surgical History:  Procedure Laterality Date  . BACK SURGERY  1998  . BREAST LUMPECTOMY WITH RADIOACTIVE SEED AND SENTINEL LYMPH NODE BIOPSY Left 03/12/2017   Procedure: LEFT BREAST LUMPECTOMY WITH BRACKETED  RADIOACTIVE SEED AND SENTINEL LYMPH NODE BIOPSY;  Surgeon: Rolm Bookbinder, MD;  Location: [place];  Service: General;  Laterality: Left;  . BREAST REDUCTION SURGERY Bilateral 03/20/2017   Procedure: BILATERAL ONCOPLASTIC BREAST REDUCTION;  Surgeon: Irene Limbo, MD;  Location: [place];  Service: Plastics;  Laterality: Bilateral;  . CHOLECYSTECTOMY  1998  . LIPOMA EXCISION Right 04/2016   shoulder blade area  . WISDOM TOOTH EXTRACTION      There were no vitals filed for this visit.  Subjective Assessment - 11/12/17 1028    Subjective  I saw my doctor after my last visit and she said I have costochondritis at my ribs so she  prescribed me Meloxicam and by the second day I was feeling much better. I've been doing the self MLD most days except past few because we've had so much going on. But my Lt breast really is feeling better.     Pertinent History  Left breast cancer diagnosed 01/07/17. Went for needle biopsy and breast MRI.  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  Tumor was 9 cm. so had reduction on both sides by Dr. Iran Planas on 03/20/17.  Finished XRT 07/06/17 and is now on anastrozole.  Has a rapid heart breat so is on Toprol.  Reports her knees pop, left > right, and hurt on stairs (or hips hurt).  Has seen rheumatologist who thought she had psoriatic arthritis and was on methotrexate for a while, but she stopped that.  This bothers mainly in her hands; hips and shoulders were helped with a new mattress and hands and hips helped with cleaner eating. Gets left leg pain (buttocks to left knee on lateral aspect) and it goes numb with prolonged standing or walking. Had ruptured disc with microdiscectomy in 1998 for right side symptoms, which was successful, but has occasional flareup. Right hand goes numb at times and whole arm goes numb if she lies on her right side at  night. Having some right foot pain.  Right foot supinates. Also says she has some left heelspur trouble.    Patient Stated Goals  Make sure the left breast swelling isn't lymphedema; find out about my soreness, pain and swelling in my breast.    Currently in Pain?  No/denies                      [facility] Adult PT Treatment/Exercise - 11/12/17 0001      Manual Therapy   Manual Therapy  Manual Lymphatic Drainage (MLD)    Manual therapy comments  Issued chip pack for pt to wear at Lt lateral breast.     Manual Lymphatic Drainage (MLD)  In Supine: Short neck, superficial and deep abdominals; Rt axilla and Lt inguinal nodes, then anterior inter-axillary and Lt axillo-inguinal anastomosis  and then focused on Lt breast, also in Rt S/L for posterior inter-axillary and Lt axillo-inguinal anastomosis.                     [facility] Goals - 10/13/17 1725      CC Long Term Goal  #1   Title  Pt. will be knowledgeable about where and how to obtain a compression bra that fits properly and provides appropriate compression.    Time  3    Period  Days    Status  New      CC Long Term Goal  #2   Title  Pt. will be independent in performing self-manual lymph drainage.    Time  3    Period  Weeks    Status  New      CC Long Term Goal  #3   Title  Pt. will report at least 35% decrease in discomfort that she experiences in the left breast/axilla area.    Time  3    Period  Weeks    Status  New         Plan - 11/12/17 1217    Clinical Impression Statement  Pt has made excellent progress overall. Her Lt breast swelling is imporved with MLD here and self at home. She has been wearing a bra from [facility] [place] that provides good coverage but not great compression so issued chip pack for her to wear at lateral breast where she has most c/o swelling. She still plans to get to [facility] [place] to be measured for a compression bra but has yet to have time to get there yet. Discussed possibilty of D/C at next visit as pt has made good progress.    Rehab Potential  Good    PT Frequency  1x / week    PT Duration  3 weeks    PT Treatment/Interventions  ADLs/Self Care Home Management;DME Instruction;Therapeutic exercise;Patient/family education;Orthotic Fit/Training;Manual techniques;Manual lymph drainage;Compression bandaging    PT Next Visit Plan  Potential D/C next visit but will need renewal if pt wants to cont. Review self MLD and see if she has called [facility] [place] to be fitted for a compression bra and issue script if returned signed.     Recommended Other Services  Faxed scipt for a compression bra to Dr. Tonita Cong and Agree with Plan of Care   Patient       Patient will benefit from skilled therapeutic intervention in order to improve the following deficits and impairments:  Increased edema, Pain, Decreased knowledge of use of DME, Decreased knowledge of precautions  Visit Diagnosis: Localized edema     Problem List Patient Active Problem List   Diagnosis Date Noted  . Breast cancer of lower-inner quadrant of left female breast ([place]) 03/19/2017  . Genetic testing 01/30/2017  . Breast cancer ([place])   . Family history of breast cancer   . Family history of colon cancer     Otelia Limes, PTA 11/12/2017, 12:28 PM  [place], [name], [address] Phone: [phone]   Fax:  [phone]  Name: Anne Lawrence MRN: [id number] Date of Birth: January 07, 1966

## 2017-11-19 ENCOUNTER — Ambulatory Visit: Payer: PRIVATE HEALTH INSURANCE

## 2017-11-19 DIAGNOSIS — R600 Localized edema: Secondary | ICD-10-CM

## 2017-11-19 NOTE — Therapy (Addendum)
[place], [name], [address] Phone: [phone]   Fax:  [phone]  Physical Therapy Treatment  Patient Details  Name: Anne Lawrence MRN: [id number] Date of Birth: [birth year] Referring Provider: Dr. Irene Limbo   Encounter Date: 11/19/2017  PT End of Session - 11/19/17 1206    Visit Number  5    Number of Visits  4    Date for PT Re-Evaluation  11/16/17    PT Start Time  1111    PT Stop Time  1201    PT Time Calculation (min)  50 min    Activity Tolerance  Patient tolerated treatment well    Behavior During Therapy  [facility] for tasks assessed/performed       Past Medical History:  Diagnosis Date  . Arthritis   . Breast cancer (HCC)    DCIS ER/PR+  . Bronchitis   . Family history of breast cancer   . Family history of colon cancer   . GERD (gastroesophageal reflux disease)   . Headache   . History of kidney stones   . Macromastia   . PONV (postoperative nausea and vomiting)    n/v with [id number]  . Rapid heart beat    takes metoprolol    Past Surgical History:  Procedure Laterality Date  . BACK SURGERY  1998  . BREAST LUMPECTOMY WITH RADIOACTIVE SEED AND SENTINEL LYMPH NODE BIOPSY Left 03/12/2017   Procedure: LEFT BREAST LUMPECTOMY WITH BRACKETED  RADIOACTIVE SEED AND SENTINEL LYMPH NODE BIOPSY;  Surgeon: Rolm Bookbinder, MD;  Location: [facility];  Service: General;  Laterality: Left;  . BREAST REDUCTION SURGERY Bilateral 03/20/2017   Procedure: BILATERAL ONCOPLASTIC BREAST REDUCTION;  Surgeon: Irene Limbo, MD;  Location: [facility];  Service: Plastics;  Laterality: Bilateral;  . CHOLECYSTECTOMY  1998  . LIPOMA EXCISION Right 04/2016   shoulder blade area  . WISDOM TOOTH EXTRACTION      There were no vitals filed for this visit.  Subjective Assessment - 11/19/17 1121    Subjective  I missed a few days of the Meloxicam and the pain in my ribs started back (I kept just forgetting)  so I've gotten back on it the past 2 days and I can already tell an improvement. My Lt breast overall is definietly feeling better, especially since the rib pain has calmed down. I think Im okay to stop coming to therapy after today but I want to be on hold in case I have a flare up again.     Pertinent History  Left breast cancer diagnosed 01/07/17. Went for needle biopsy and breast MRI.  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  Tumor was 9 cm. so had reduction on both sides by Dr. Iran Planas on 03/20/17.  Finished XRT 07/06/17 and is now on anastrozole.  Has a rapid heart breat so is on Toprol.  Reports her knees pop, left > right, and hurt on stairs (or hips hurt).  Has seen rheumatologist who thought she had psoriatic arthritis and was on methotrexate for a while, but she stopped that.  This bothers mainly in her hands; hips and shoulders were helped with a new mattress and hands and hips helped with cleaner eating. Gets left leg pain (buttocks to left knee on lateral aspect) and it goes numb with prolonged standing or walking. Had ruptured disc with microdiscectomy in 1998 for right side symptoms, which was successful, but has  occasional flareup. Right hand goes numb at times and whole arm goes numb if she lies on her right side at night. Having some right foot pain.  Right foot supinates. Also says she has some left heelspur trouble.    Patient Stated Goals  Make sure the left breast swelling isn't lymphedema; find out about my soreness, pain and swelling in my breast.    Currently in Pain?  No/denies                      [facility] Adult PT Treatment/Exercise - 11/19/17 0001      Manual Therapy   Manual Therapy  Manual Lymphatic Drainage (MLD)    Manual therapy comments  Assessed bra pt wearing today, see End of Session section.     Manual Lymphatic Drainage (MLD)  In Supine: Short neck, superficial and deep abdominals; Rt  axilla and Lt inguinal nodes, then anterior inter-axillary and Lt axillo-inguinal anastomosis and then focused on Lt breast, also in Rt S/L for posterior inter-axillary and Lt axillo-inguinal anastomosis.                     [facility] Goals - 11/19/17 1159      CC Long Term Goal  #1   Title  Pt. will be knowledgeable about where and how to obtain a compression bra that fits properly and provides appropriate compression.    Baseline  Pt knows to go back to [facility]-11/19/17    Status  Achieved      CC Long Term Goal  #2   Title  Pt. will be independent in performing self-manual lymph drainage.    Baseline  Pt is independent with this after final review today-11/19/17    Status  Achieved      CC Long Term Goal  #3   Title  Pt. will report at least 35% decrease in discomfort that she experiences in the left breast/axilla area.    Baseline  Pt reports 75-80% with this-11/19/17    Status  Achieved         Plan - 11/19/17 1211    Clinical Impression Statement  Assessed pts compression bra that she received from [facility] months ago. Pt reports has gained some weight since then and so bra feels a little tight around ribs. Also isn't a compression bra so instructed her to call [facility] to see if they can get her an extender piece for bra she's wearing now that she does like, it's just gotten a little tight. Then see if they can get her into a better fitting compression bra. Reviewed self manual lymph drainage with pt while having her perform some as well and she returned very good technique with this. Discussed discharging to day with pt and she feels ready but is afraid of having another flare up so would like to be placed on hold for 2-3 weeks. She is to call if doing well.     Rehab Potential  Good    PT Frequency  1x / week    PT Duration  3 weeks    PT Treatment/Interventions  ADLs/Self Care Home Management;DME Instruction;Therapeutic  exercise;Patient/family education;Orthotic Fit/Training;Manual techniques;Manual lymph drainage;Compression bandaging    PT Next Visit Plan  Pt on hold for next 2-3 weeks. D/C if we don't hear from her.     Consulted and Agree with Plan of Care  Patient       Patient  will benefit from skilled therapeutic intervention in order to improve the following deficits and impairments:  Increased edema, Pain, Decreased knowledge of use of DME, Decreased knowledge of precautions  Visit Diagnosis: Localized edema     Problem List Patient Active Problem List   Diagnosis Date Noted  . Breast cancer of lower-inner quadrant of left female breast ([place]) 03/19/2017  . Genetic testing 01/30/2017  . Breast cancer ([place])   . Family history of breast cancer   . Family history of colon cancer     Otelia Limes, PTA 11/19/2017, 12:17 PM  [place], [name], [address] Phone: [phone]   Fax:  [phone]  Name: Anne Lawrence MRN: [id number] Date of Birth: 1966-06-26  PHYSICAL THERAPY DISCHARGE SUMMARY  Visits from Start of Care: 5  Current functional level related to goals / functional outcomes: Goals met as noted above.   Remaining deficits: None known.   Education / Equipment: Compression bra, self-care for swelling Plan: Patient agrees to discharge.  Patient goals were met. Patient is being discharged due to meeting the stated rehab goals.  ?????    Serafina Royals, PT 02/02/18 9:14 AM

## 2017-11-25 ENCOUNTER — Ambulatory Visit: Payer: PRIVATE HEALTH INSURANCE

## 2017-12-03 DIAGNOSIS — Z923 Personal history of irradiation: Secondary | ICD-10-CM | POA: Diagnosis not present

## 2017-12-03 DIAGNOSIS — Z780 Asymptomatic menopausal state: Secondary | ICD-10-CM | POA: Diagnosis not present

## 2017-12-03 DIAGNOSIS — E538 Deficiency of other specified B group vitamins: Secondary | ICD-10-CM | POA: Diagnosis not present

## 2017-12-03 DIAGNOSIS — D170 Benign lipomatous neoplasm of skin and subcutaneous tissue of head, face and neck: Secondary | ICD-10-CM | POA: Diagnosis not present

## 2017-12-03 DIAGNOSIS — C50912 Malignant neoplasm of unspecified site of left female breast: Secondary | ICD-10-CM | POA: Diagnosis not present

## 2017-12-03 DIAGNOSIS — R945 Abnormal results of liver function studies: Secondary | ICD-10-CM | POA: Diagnosis not present

## 2017-12-03 DIAGNOSIS — Z79811 Long term (current) use of aromatase inhibitors: Secondary | ICD-10-CM | POA: Diagnosis not present

## 2017-12-14 ENCOUNTER — Other Ambulatory Visit: Payer: Self-pay | Admitting: Oncology

## 2017-12-14 DIAGNOSIS — Z853 Personal history of malignant neoplasm of breast: Secondary | ICD-10-CM

## 2017-12-15 DIAGNOSIS — J309 Allergic rhinitis, unspecified: Secondary | ICD-10-CM

## 2017-12-15 DIAGNOSIS — E7800 Pure hypercholesterolemia, unspecified: Secondary | ICD-10-CM | POA: Insufficient documentation

## 2017-12-15 DIAGNOSIS — M7710 Lateral epicondylitis, unspecified elbow: Secondary | ICD-10-CM

## 2017-12-15 DIAGNOSIS — J45909 Unspecified asthma, uncomplicated: Secondary | ICD-10-CM

## 2017-12-15 DIAGNOSIS — F419 Anxiety disorder, unspecified: Secondary | ICD-10-CM | POA: Insufficient documentation

## 2017-12-15 DIAGNOSIS — R002 Palpitations: Secondary | ICD-10-CM | POA: Insufficient documentation

## 2017-12-15 DIAGNOSIS — Z8719 Personal history of other diseases of the digestive system: Secondary | ICD-10-CM

## 2017-12-15 DIAGNOSIS — M2550 Pain in unspecified joint: Secondary | ICD-10-CM

## 2017-12-15 DIAGNOSIS — N200 Calculus of kidney: Secondary | ICD-10-CM

## 2017-12-15 DIAGNOSIS — E669 Obesity, unspecified: Secondary | ICD-10-CM

## 2017-12-15 DIAGNOSIS — K219 Gastro-esophageal reflux disease without esophagitis: Secondary | ICD-10-CM | POA: Insufficient documentation

## 2017-12-15 DIAGNOSIS — M722 Plantar fascial fibromatosis: Secondary | ICD-10-CM

## 2017-12-15 DIAGNOSIS — B372 Candidiasis of skin and nail: Secondary | ICD-10-CM

## 2017-12-15 DIAGNOSIS — M25519 Pain in unspecified shoulder: Secondary | ICD-10-CM

## 2017-12-15 DIAGNOSIS — K648 Other hemorrhoids: Secondary | ICD-10-CM

## 2017-12-15 DIAGNOSIS — K2100 Gastro-esophageal reflux disease with esophagitis, without bleeding: Secondary | ICD-10-CM

## 2017-12-15 DIAGNOSIS — Z860100 Personal history of colon polyps, unspecified: Secondary | ICD-10-CM

## 2017-12-15 DIAGNOSIS — E559 Vitamin D deficiency, unspecified: Secondary | ICD-10-CM

## 2017-12-15 DIAGNOSIS — M5412 Radiculopathy, cervical region: Secondary | ICD-10-CM

## 2017-12-15 HISTORY — DX: Other hemorrhoids: K64.8

## 2017-12-15 HISTORY — DX: Pain in unspecified shoulder: M25.519

## 2017-12-15 HISTORY — DX: Pain in unspecified joint: M25.50

## 2017-12-15 HISTORY — DX: Calculus of kidney: N20.0

## 2017-12-15 HISTORY — DX: Obesity, unspecified: E66.9

## 2017-12-15 HISTORY — DX: Personal history of other diseases of the digestive system: Z87.19

## 2017-12-15 HISTORY — DX: Vitamin D deficiency, unspecified: E55.9

## 2017-12-15 HISTORY — DX: Candidiasis of skin and nail: B37.2

## 2017-12-15 HISTORY — DX: Radiculopathy, cervical region: M54.12

## 2017-12-15 HISTORY — DX: Gastro-esophageal reflux disease without esophagitis: K21.9

## 2017-12-15 HISTORY — DX: Allergic rhinitis, unspecified: J30.9

## 2017-12-15 HISTORY — DX: Plantar fascial fibromatosis: M72.2

## 2017-12-15 HISTORY — DX: Unspecified asthma, uncomplicated: J45.909

## 2017-12-15 HISTORY — DX: Personal history of colon polyps, unspecified: Z86.0100

## 2017-12-15 HISTORY — DX: Palpitations: R00.2

## 2017-12-15 HISTORY — DX: Gastro-esophageal reflux disease with esophagitis, without bleeding: K21.00

## 2017-12-15 HISTORY — DX: Anxiety disorder, unspecified: F41.9

## 2017-12-15 HISTORY — DX: Pure hypercholesterolemia, unspecified: E78.00

## 2017-12-15 HISTORY — DX: Lateral epicondylitis, unspecified elbow: M77.10

## 2018-01-11 ENCOUNTER — Other Ambulatory Visit: Payer: Self-pay | Admitting: General Surgery

## 2018-01-11 DIAGNOSIS — Z171 Estrogen receptor negative status [ER-]: Principal | ICD-10-CM

## 2018-01-11 DIAGNOSIS — C50912 Malignant neoplasm of unspecified site of left female breast: Secondary | ICD-10-CM

## 2018-01-11 DIAGNOSIS — Z853 Personal history of malignant neoplasm of breast: Secondary | ICD-10-CM

## 2018-01-20 ENCOUNTER — Ambulatory Visit: Admission: RE | Admit: 2018-01-20 | Payer: PRIVATE HEALTH INSURANCE | Source: Ambulatory Visit

## 2018-01-20 ENCOUNTER — Ambulatory Visit
Admission: RE | Admit: 2018-01-20 | Discharge: 2018-01-20 | Disposition: A | Discharge status: Home / Self Care | Payer: PRIVATE HEALTH INSURANCE | Source: Ambulatory Visit | Attending: General Surgery | Admitting: General Surgery

## 2018-01-20 DIAGNOSIS — C50912 Malignant neoplasm of unspecified site of left female breast: Secondary | ICD-10-CM

## 2018-01-20 DIAGNOSIS — Z171 Estrogen receptor negative status [ER-]: Principal | ICD-10-CM

## 2018-01-24 IMAGING — MG NEEDLE LOCALIZATION OF THE LEFT BREAST WITH MAMMO GUIDANCE
1 series · 1 of 1 positions shown · non-contrast
Comparison: Previous exam(s).

CLINICAL DATA: Recently diagnosed ductal carcinoma in situ in the
posterior aspect of the lower outer quadrant of the left breast. Set

EXAM:
MAMMOGRAPHIC GUIDED RADIOACTIVE SEED LOCALIZATION OF THE LEFT BREAST

[L CC]
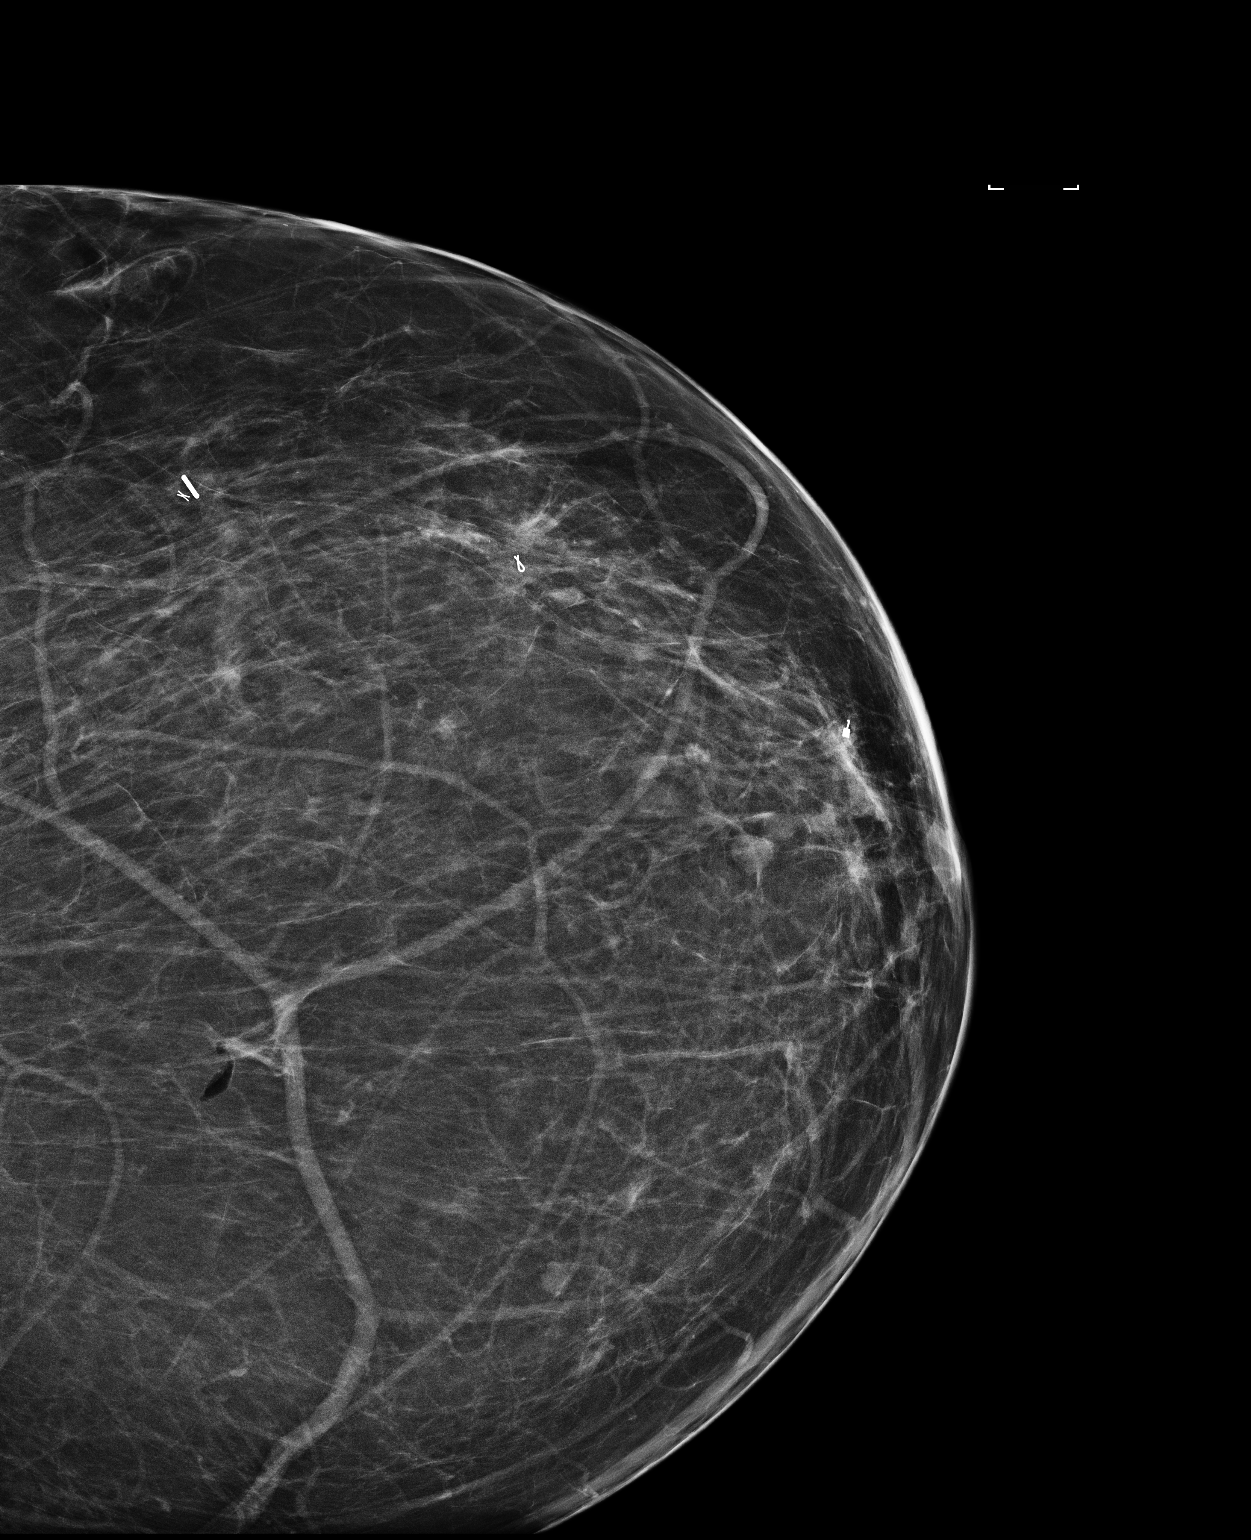

[1 of 1 positions shown; findings below may reference images not displayed]

FINDINGS: Patient presents for radioactive seed localization prior to left
lumpectomy. I met with the patient and we discussed the procedure of
seed localization including benefits and alternatives. We discussed
the high likelihood of a successful procedure. We discussed the
risks of the procedure including infection, bleeding, tissue injury
and further surgery. We discussed the low dose of radioactivity
involved in the procedure. Informed, written consent was given.

The usual time-out protocol was performed immediately prior to the
procedure.

Using mammographic guidance, sterile technique, 1% lidocaine and an
[id number] radioactive seed, X shaped biopsy marker clip in the posterior
aspect of the lower outer quadrant of the left breast was localized
using a lateral approach. The follow-up mammogram images confirm the
seed in the expected location and were marked for Dr. Jim.

Follow-up survey of the patient confirms presence of the radioactive
seed.

Order number of [id number] seed:  [id number].

Total activity:  0.252 millicurie  Reference Date: 02/10/2017

The patient tolerated the procedure well and was released from the
[REDACTED]. She was given instructions regarding seed removal.
IMPRESSION: Radioactive seed localization left breast. No apparent
complications.

## 2018-01-24 IMAGING — MG MM PLC BREAST LOC DEV EA ADD LESION  INC MAMMO GUIDE*L*
2 series · 2 of 2 positions shown · non-contrast
Comparison: Previous exam(s).

CLINICAL DATA: Recently diagnosed ductal carcinoma in situ in the
mid and posterior portions of the lower outer quadrant of the left
breast. The anterior extent of calcifications in that area was
recently biopsied with benign results felt to be discordant with
excision recommended. Following presentation at the
multidisciplinary conference, placement of a radioactive seed 3 cm
anterior to the ribbon shaped biopsy marker clip, at the location of
suspicious unbiopsied calcifications was requested.

EXAM:
MAMMOGRAPHIC GUIDED RADIOACTIVE SEED LOCALIZATION OF THE LEFT BREAST

[L LM]
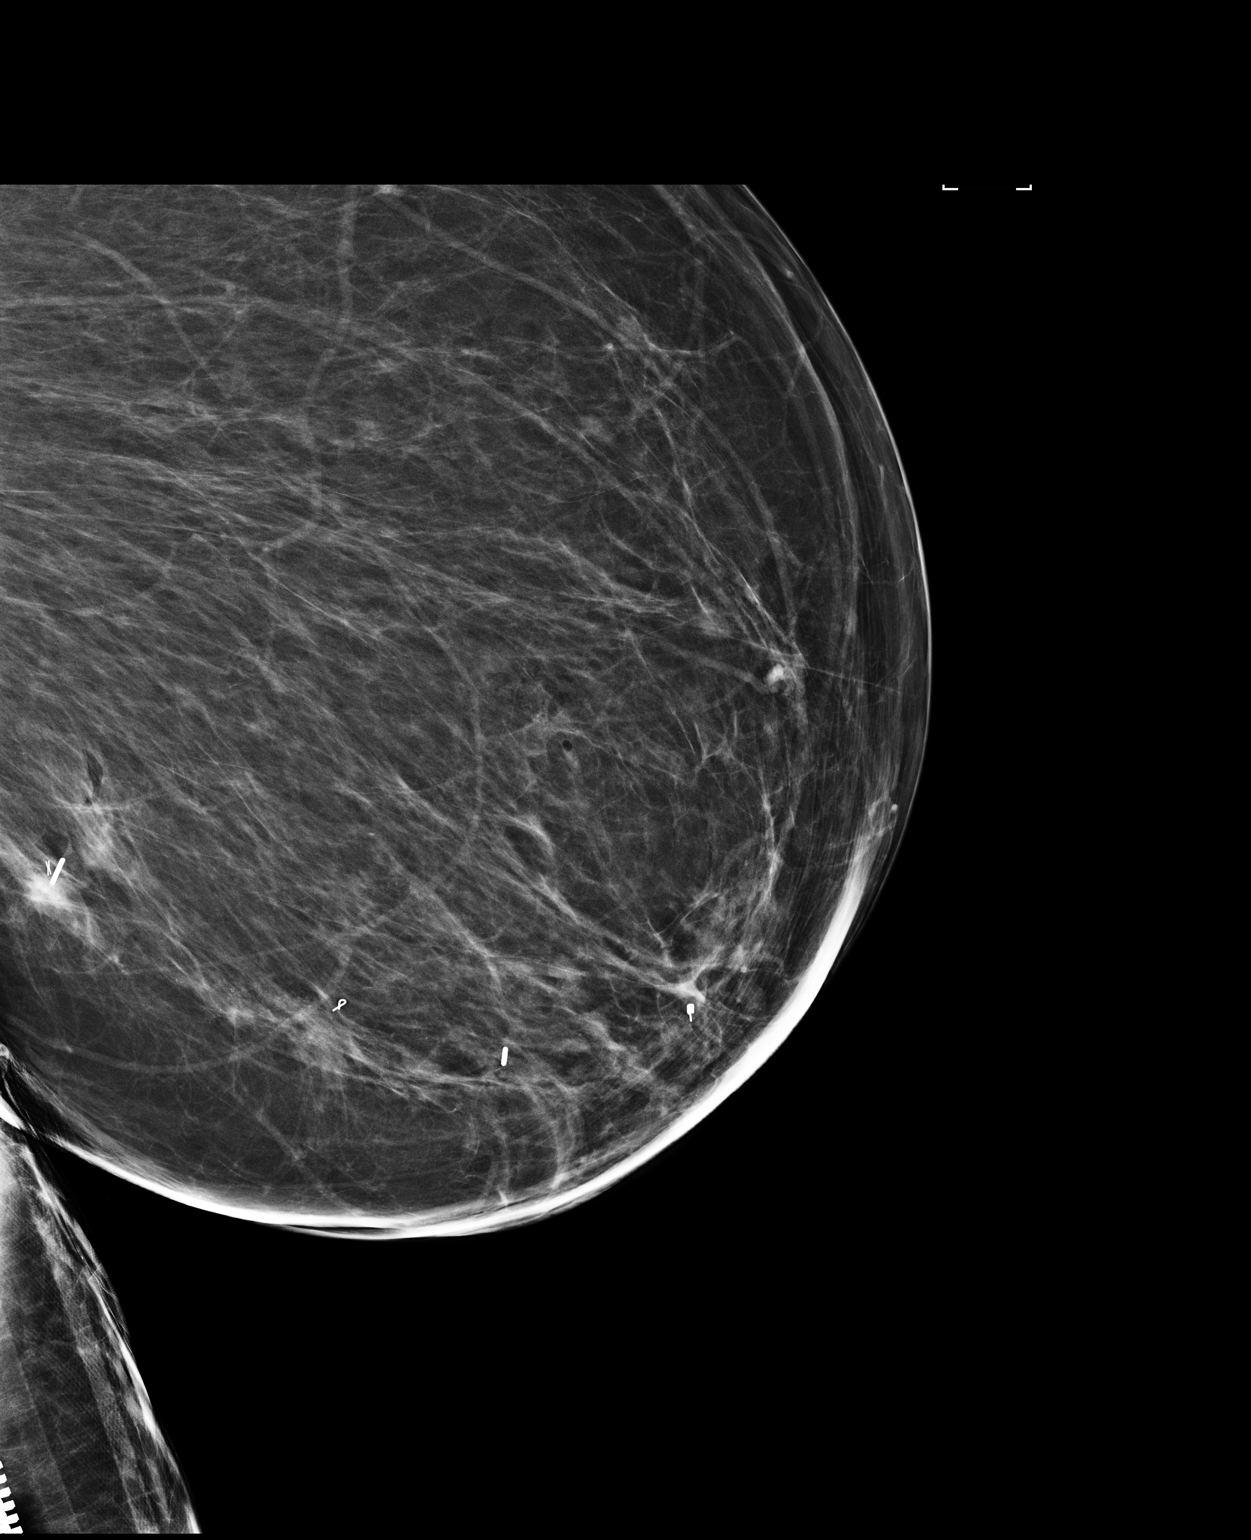

[L CC]
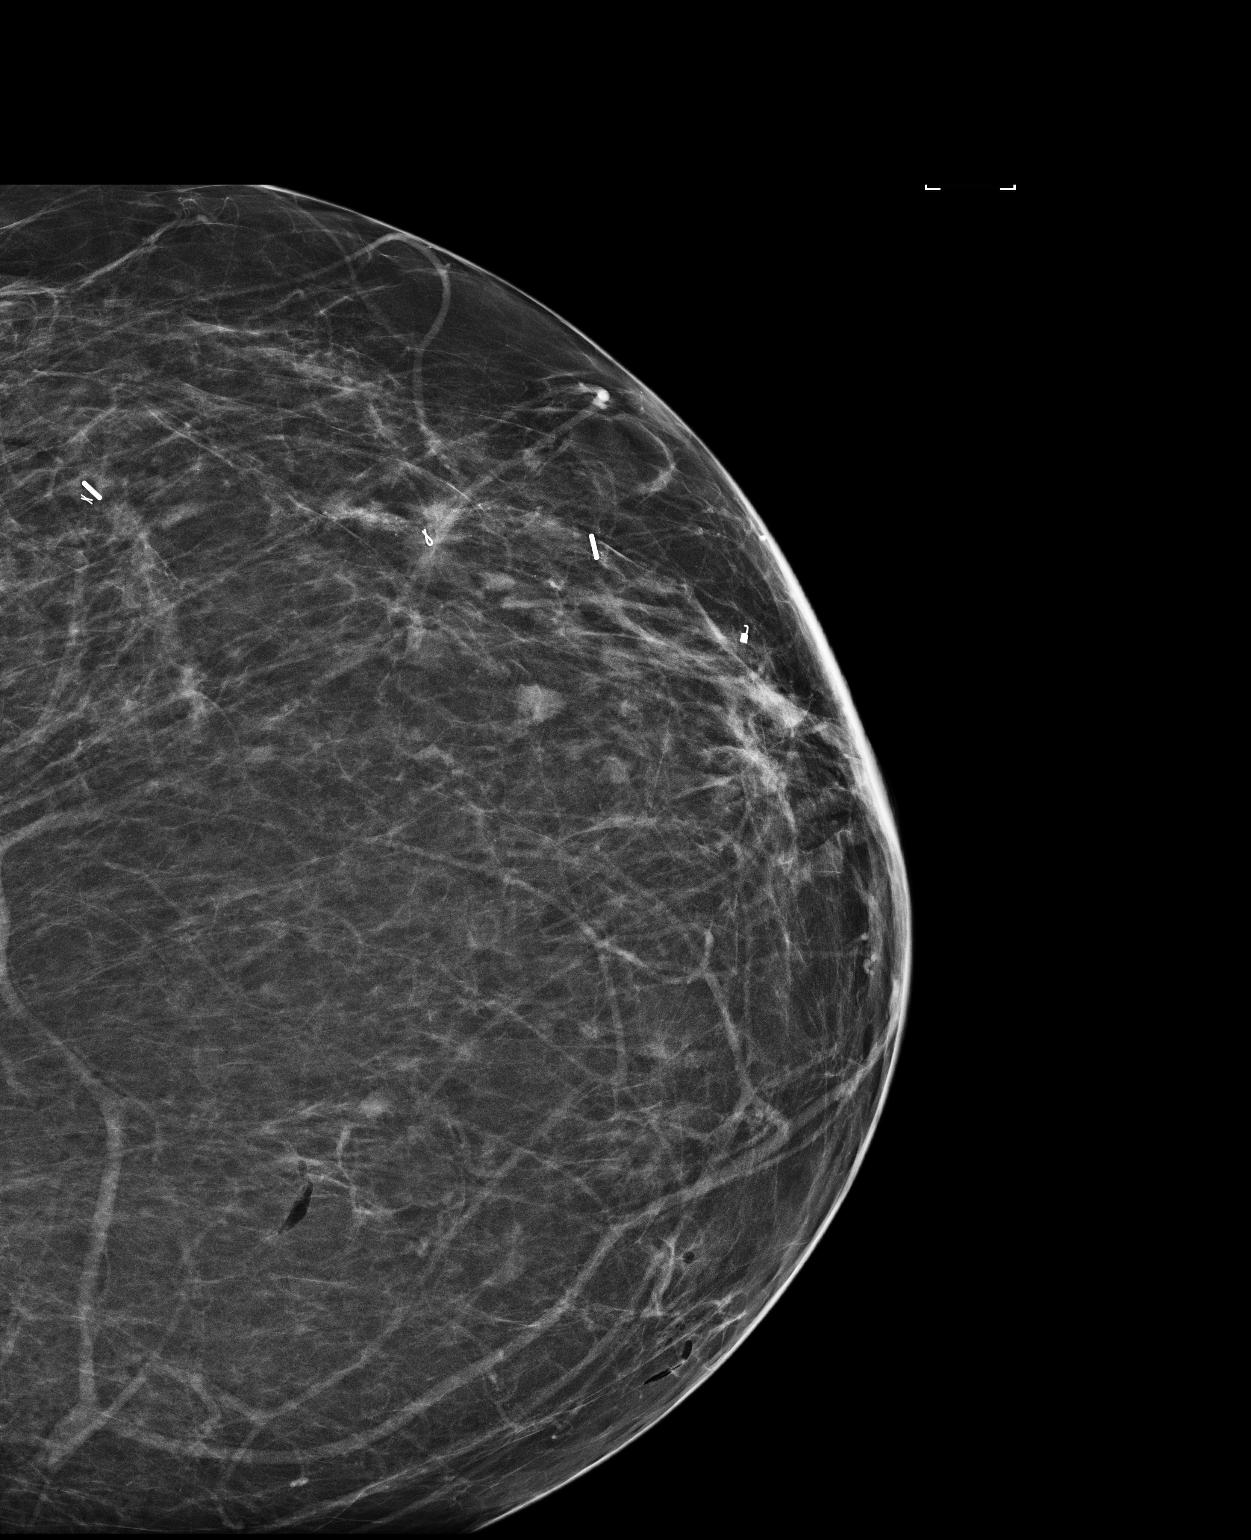

[2 of 2 positions shown; findings below may reference images not displayed]

FINDINGS: Patient presents for radioactive seed localization prior to left
lumpectomy. I met with the patient and we discussed the procedure of
seed localization including benefits and alternatives. We discussed
the high likelihood of a successful procedure. We discussed the
risks of the procedure including infection, bleeding, tissue injury
and further surgery. We discussed the low dose of radioactivity
involved in the procedure. Informed, written consent was given.

The usual time-out protocol was performed immediately prior to the
procedure.

Using mammographic guidance, sterile technique, 1% lidocaine and an
3-8SC radioactive seed, the suspicious unbiopsied calcifications 3
cm anterior to the ribbon shaped biopsy marker clip were localized
using a lateral approach. The follow-up mammogram images confirm the
seed in the expected location and were marked for Dr. Vaz De Miranda.

Follow-up survey of the patient confirms presence of the radioactive
seed.

Order number of [id number] seed:  [id number].

Total activity:  0.252 millicurie  Reference Date: 02/10/2017

The patient tolerated the procedure well and was released from the
[REDACTED]. She was given instructions regarding seed removal.
IMPRESSION: Radioactive seed localization left breast. No apparent
complications.

## 2018-05-26 DIAGNOSIS — D170 Benign lipomatous neoplasm of skin and subcutaneous tissue of head, face and neck: Secondary | ICD-10-CM | POA: Diagnosis not present

## 2018-05-26 DIAGNOSIS — Z780 Asymptomatic menopausal state: Secondary | ICD-10-CM

## 2018-05-26 DIAGNOSIS — E538 Deficiency of other specified B group vitamins: Secondary | ICD-10-CM

## 2018-05-26 DIAGNOSIS — C50912 Malignant neoplasm of unspecified site of left female breast: Secondary | ICD-10-CM | POA: Diagnosis not present

## 2018-05-26 DIAGNOSIS — R079 Chest pain, unspecified: Secondary | ICD-10-CM

## 2018-05-26 DIAGNOSIS — R740 Nonspecific elevation of levels of transaminase and lactic acid dehydrogenase [LDH]: Secondary | ICD-10-CM | POA: Diagnosis not present

## 2018-05-26 DIAGNOSIS — Z923 Personal history of irradiation: Secondary | ICD-10-CM

## 2018-05-26 DIAGNOSIS — Z79811 Long term (current) use of aromatase inhibitors: Secondary | ICD-10-CM

## 2018-08-26 DIAGNOSIS — C50912 Malignant neoplasm of unspecified site of left female breast: Secondary | ICD-10-CM

## 2018-08-26 DIAGNOSIS — R21 Rash and other nonspecific skin eruption: Secondary | ICD-10-CM

## 2018-08-26 DIAGNOSIS — Z780 Asymptomatic menopausal state: Secondary | ICD-10-CM

## 2018-08-26 DIAGNOSIS — Z923 Personal history of irradiation: Secondary | ICD-10-CM

## 2018-08-26 DIAGNOSIS — Z79811 Long term (current) use of aromatase inhibitors: Secondary | ICD-10-CM

## 2018-09-03 DIAGNOSIS — R000 Tachycardia, unspecified: Secondary | ICD-10-CM

## 2018-09-03 HISTORY — DX: Tachycardia, unspecified: R00.0

## 2018-09-07 ENCOUNTER — Ambulatory Visit: Payer: PRIVATE HEALTH INSURANCE | Admitting: Cardiology

## 2018-09-07 ENCOUNTER — Encounter: Payer: Self-pay | Admitting: Cardiology

## 2018-09-07 VITALS — BP 130/60 | HR 76 | Ht 62.0 in | Wt 267.0 lb

## 2018-09-07 DIAGNOSIS — R079 Chest pain, unspecified: Secondary | ICD-10-CM | POA: Diagnosis not present

## 2018-09-07 DIAGNOSIS — R0789 Other chest pain: Secondary | ICD-10-CM | POA: Insufficient documentation

## 2018-09-07 DIAGNOSIS — Z803 Family history of malignant neoplasm of breast: Secondary | ICD-10-CM

## 2018-09-07 DIAGNOSIS — R002 Palpitations: Secondary | ICD-10-CM

## 2018-09-07 HISTORY — DX: Other chest pain: R07.89

## 2018-09-07 NOTE — Patient Instructions (Addendum)
Medication Instructions:  Your physician recommends that you continue on your current medications as directed. Please refer to the Current Medication list given to you today.  If you need a refill on your cardiac medications before your next appointment, please call your pharmacy.   Lab work: None.  If you have labs (blood work) drawn today and your tests are completely normal, you will receive your results only by: Marland Kitchen MyChart Message (if you have MyChart) OR . A paper copy in the mail If you have any lab test that is abnormal or we need to change your treatment, we will call you to review the results.  Testing/Procedures: Your physician has requested that you have en exercise stress myoview. For further information please visit [email or web address]. Please follow instruction sheet, as given.  Your physician has requested that you have an echocardiogram. Echocardiography is a painless test that uses sound waves to create images of your heart. It provides your doctor with information about the size and shape of your heart and how well your heart's chambers and valves are working. This procedure takes approximately one hour. There are no restrictions for this procedure.     Follow-Up: At [facility], you and your health needs are our priority.  As part of our continuing mission to provide you with exceptional heart care, we have created designated Provider Care Teams.  These Care Teams include your primary Cardiologist (physician) and Advanced Practice Providers (APPs -  Physician Assistants and Nurse Practitioners) who all work together to provide you with the care you need, when you need it. You will need a follow up appointment in 1 months.  Please call our office 2 months in advance to schedule this appointment.  You may see Jenne Campus, MD or another member of our [facility] Provider Team in [place]: Shirlee More, MD . Jyl Heinz, MD  Any Other Special Instructions Will Be  Listed Below (If Applicable).   Cardiac Nuclear Scan A cardiac nuclear scan is a test that measures blood flow to the heart when a person is resting and when he or she is exercising. The test looks for problems such as:  Not enough blood reaching a portion of the heart.  The heart muscle not working normally.  You may need this test if:  You have heart disease.  You have had abnormal lab results.  You have had heart surgery or angioplasty.  You have chest pain.  You have shortness of breath.  In this test, a radioactive dye (tracer) is injected into your bloodstream. After the tracer has traveled to your heart, an imaging device is used to measure how much of the tracer is absorbed by or distributed to various areas of your heart. This procedure is usually done at a hospital and takes 2-4 hours. Tell a health care provider about:  Any allergies you have.  All medicines you are taking, including vitamins, herbs, eye drops, creams, and over-the-counter medicines.  Any problems you or family members have had with the use of anesthetic medicines.  Any blood disorders you have.  Any surgeries you have had.  Any medical conditions you have.  Whether you are pregnant or may be pregnant. What are the risks? Generally, this is a safe procedure. However, problems may occur, including:  Serious chest pain and heart attack. This is only a risk if the stress portion of the test is done.  Rapid heartbeat.  Sensation of warmth in your chest. This usually passes quickly.  What  happens before the procedure?  Ask your health care provider about changing or stopping your regular medicines. This is especially important if you are taking diabetes medicines or blood thinners.  Remove your jewelry on the day of the procedure. What happens during the procedure?  An IV tube will be inserted into one of your veins.  Your health care provider will inject a small amount of radioactive  tracer through the tube.  You will wait for 20-40 minutes while the tracer travels through your bloodstream.  Your heart activity will be monitored with an electrocardiogram (ECG).  You will lie down on an exam table.  Images of your heart will be taken for about 15-20 minutes.  You may be asked to exercise on a treadmill or stationary bike. While you exercise, your heart's activity will be monitored with an ECG, and your blood pressure will be checked. If you are unable to exercise, you may be given a medicine to increase blood flow to parts of your heart.  When blood flow to your heart has peaked, a tracer will again be injected through the IV tube.  After 20-40 minutes, you will get back on the exam table and have more images taken of your heart.  When the procedure is over, your IV tube will be removed. The procedure may vary among health care providers and hospitals. Depending on the type of tracer used, scans may need to be repeated 3-4 hours later. What happens after the procedure?  Unless your health care provider tells you otherwise, you may return to your normal schedule, including diet, activities, and medicines.  Unless your health care provider tells you otherwise, you may increase your fluid intake. This will help flush the contrast dye from your body. Drink enough fluid to keep your urine clear or pale yellow.  It is up to you to get your test results. Ask your health care provider, or the department that is doing the test, when your results will be ready. Summary  A cardiac nuclear scan measures the blood flow to the heart when a person is resting and when he or she is exercising.  You may need this test if you are at risk for heart disease.  Tell your health care provider if you are pregnant.  Unless your health care provider tells you otherwise, increase your fluid intake. This will help flush the contrast dye from your body. Drink enough fluid to keep your urine  clear or pale yellow. This information is not intended to replace advice given to you by your health care provider. Make sure you discuss any questions you have with your health care provider. Document Released: 12/05/2004 Document Revised: 11/12/2016 Document Reviewed: 10/19/2013 Elsevier Interactive Patient Education  2017 [place].  Echocardiogram An echocardiogram, or echocardiography, uses sound waves (ultrasound) to produce an image of your heart. The echocardiogram is simple, painless, obtained within a short period of time, and offers valuable information to your health care provider. The images from an echocardiogram can provide information such as:  Evidence of coronary artery disease (CAD).  Heart size.  Heart muscle function.  Heart valve function.  Aneurysm detection.  Evidence of a past heart attack.  Fluid buildup around the heart.  Heart muscle thickening.  Assess heart valve function.  Tell a health care provider about:  Any allergies you have.  All medicines you are taking, including vitamins, herbs, eye drops, creams, and over-the-counter medicines.  Any problems you or family members have had with  anesthetic medicines.  Any blood disorders you have.  Any surgeries you have had.  Any medical conditions you have.  Whether you are pregnant or may be pregnant. What happens before the procedure? No special preparation is needed. Eat and drink normally. What happens during the procedure?  In order to produce an image of your heart, gel will be applied to your chest and a wand-like tool (transducer) will be moved over your chest. The gel will help transmit the sound waves from the transducer. The sound waves will harmlessly bounce off your heart to allow the heart images to be captured in real-time motion. These images will then be recorded.  You may need an IV to receive a medicine that improves the quality of the pictures. What happens after the  procedure? You may return to your normal schedule including diet, activities, and medicines, unless your health care provider tells you otherwise. This information is not intended to replace advice given to you by your health care provider. Make sure you discuss any questions you have with your health care provider. Document Released: 11/07/2000 Document Revised: 06/28/2016 Document Reviewed: 07/18/2013 Elsevier Interactive Patient Education  2017 [employer].

## 2018-09-07 NOTE — Progress Notes (Signed)
Cardiology Consultation:    Date:  09/07/2018   ID:  Anne Lawrence, DOB Aug 31, 1966, MRN [id number]  PCP:  Myrlene Broker, MD  Cardiologist:  Jenne Campus, MD   Referring MD: Myrlene Broker, MD   Chief Complaint  Patient presents with  . Irregular Heart Rate  . Chest Pain    A few episodes in June, with arm pain and jaw pain  I had a chest pain  History of Present Illness:    Anne Lawrence is a 52 y.o. female who is being seen today for the evaluation of chest pain at the request of Myrlene Broker, MD.  In June she had episode of chest pain she described a sense of pressure tightness in the chest lasting for a few hours.  It was associated with some sweating as well as nausea.  She had another similar episode but much lighter few weeks later and since that time the monitor normal travel.  She does not have any exertional tightness squeezing pressure burning chest.  She gets easily short of breath while walking but no chest pain.  The episodes that she had in June started when she was at rest.  She did not look for any medical attention for it. Her risk factors include diabetes, dyslipidemia.  Her family history of coronary artery disease.  Past Medical History:  Diagnosis Date  . Arthritis   . Breast cancer (HCC)    DCIS ER/PR+  . Bronchitis   . Family history of breast cancer   . Family history of colon cancer   . GERD (gastroesophageal reflux disease)   . Headache   . History of kidney stones   . Macromastia   . PONV (postoperative nausea and vomiting)    n/v with [phone]  . Rapid heart beat    takes metoprolol    Past Surgical History:  Procedure Laterality Date  . BACK SURGERY  1998  . BREAST LUMPECTOMY WITH RADIOACTIVE SEED AND SENTINEL LYMPH NODE BIOPSY Left 03/12/2017   Procedure: LEFT BREAST LUMPECTOMY WITH BRACKETED  RADIOACTIVE SEED AND SENTINEL LYMPH NODE BIOPSY;  Surgeon: Rolm Bookbinder, MD;  Location: [facility] [facility];  Service: General;  Laterality:  Left;  . BREAST REDUCTION SURGERY Bilateral 03/20/2017   Procedure: BILATERAL ONCOPLASTIC BREAST REDUCTION;  Surgeon: Irene Limbo, MD;  Location: [facility];  Service: Plastics;  Laterality: Bilateral;  . CHOLECYSTECTOMY  1998  . LIPOMA EXCISION Right 04/2016   shoulder blade area  . WISDOM TOOTH EXTRACTION      Current Medications: Current Meds  Medication Sig  . acetaminophen (TYLENOL) 500 MG tablet Take 500 mg by mouth every 6 (six) hours as needed.  Marland Kitchen anastrozole (ARIMIDEX) 1 MG tablet Take 1 mg by mouth daily.  . Carboxymethylcellul-Glycerin (LUBRICATING EYE DROPS OP) Apply 1 drop to eye daily as needed (dry eyes).  . Chlorpheniramine-PSE-Ibuprofen (ADVIL ALLERGY SINUS PO) Take 1 tablet by mouth daily as needed (allergies).  . Cholecalciferol (VITAMIN D3) LIQD Take 2 drops by mouth 2 (two) times a week.  . cholestyramine (QUESTRAN) 4 g packet Take 1 g by mouth daily.  . Cyanocobalamin (B-12 COMPLIANCE INJECTION IJ) Inject as directed every 30 (thirty) days.  . Digestive Enzymes (MULTI-ENZYME PO) Take 1 scoop by mouth daily. Enzyme multi vitamin powder "clearvite pch"  . esomeprazole (NEXIUM) 40 MG capsule Take 40 mg by mouth daily as needed (acid reflux).  Marland Kitchen ibuprofen (ADVIL,MOTRIN) 200 MG tablet Take 200 mg by mouth every 6 (six) hours as needed (  takes 3 tablets).  Marland Kitchen levocetirizine (XYZAL) 5 MG tablet Take 5 mg by mouth daily as needed for allergies.  . metoprolol succinate (TOPROL-XL) 25 MG 24 hr tablet Take 25 mg by mouth at bedtime.  . Vitamin E LIQD Take 2 drops by mouth 2 (two) times a week.     Allergies:   Sulfamethoxazole-trimethoprim   Social History   Socioeconomic History  . Marital status: Married    Spouse name: Not on file  . Number of children: Not on file  . Years of education: Not on file  . Highest education level: Not on file  Occupational History  . Not on file  Social Needs  . Financial resource strain: Not on file  . Food insecurity:    Worry: Not  on file    Inability: Not on file  . Transportation needs:    Medical: Not on file    Non-medical: Not on file  Tobacco Use  . Smoking status: Never Smoker  . Smokeless tobacco: Never Used  Substance and Sexual Activity  . Alcohol use: No  . Drug use: No  . Sexual activity: Not on file  Lifestyle  . Physical activity:    Days per week: Not on file    Minutes per session: Not on file  . Stress: Not on file  Relationships  . Social connections:    Talks on phone: Not on file    Gets together: Not on file    Attends religious service: Not on file    Active member of club or organization: Not on file    Attends meetings of clubs or organizations: Not on file    Relationship status: Not on file  Other Topics Concern  . Not on file  Social History Narrative  . Not on file     Family History: The patient's family history includes Breast cancer in her cousin and cousin; Breast cancer (age of onset: 27) in her mother; COPD in her paternal grandfather; Colon cancer in her maternal grandmother; Colon cancer (age of onset: 46) in her brother; Congestive Heart Failure in her maternal aunt and maternal uncle; Heart attack in her father and maternal grandfather; Leukemia in her maternal aunt; Lung cancer in her maternal aunt; Skin cancer in her paternal aunt and paternal uncle; Stroke in her paternal grandmother. ROS:   Please see the history of present illness.    All 14 point review of systems negative except as described per history of present illness.  EKGs/Labs/Other Studies Reviewed:    The following studies were reviewed today:   EKG:  EKG is  ordered today.  The ekg ordered today demonstrates normal sinus rhythm normal PR interval low QRS voltage, cannot rule out anterior wall microinfarction.  Recent Labs: No results found for requested labs within last 8760 hours.  Recent Lipid Panel No results found for: CHOL, TRIG, HDL, CHOLHDL, VLDL, LDLCALC, LDLDIRECT  Physical Exam:      VS:  BP 130/60   Pulse 76   Ht 5\' 2"  (1.575 m)   Wt 267 lb (121.1 kg)   SpO2 97%   BMI 48.83 kg/m     Wt Readings from Last 3 Encounters:  09/07/18 267 lb (121.1 kg)  03/20/17 257 lb (116.6 kg)  03/05/17 257 lb (116.6 kg)     GEN:  Well nourished, well developed in no acute distress HEENT: Normal NECK: No JVD; No carotid bruits LYMPHATICS: No lymphadenopathy CARDIAC: RRR, no murmurs, no rubs, no gallops RESPIRATORY:  Clear to auscultation without rales, wheezing or rhonchi  ABDOMEN: Soft, non-tender, non-distended MUSCULOSKELETAL:  No edema; No deformity  SKIN: Warm and dry NEUROLOGIC:  Alert and oriented x 3 PSYCHIATRIC:  Normal affect   ASSESSMENT:    1. Chest pain, unspecified type   2. Atypical chest pain   3. Palpitations   4. Family history of breast cancer    PLAN:    In order of problems listed above:  1. Chest pain somewhat atypical but latest does have multiple risk factors for coronary artery disease.  I will ask her to have an echocardiogram done as well as stress test.  We will try to walk on the treadmill she cannot do it we will switch to [address].  She is already taking appropriate medication I will continue. 2. Palpitations patient apparently about 5 years ago she was diagnosed with some kind of arrhythmia she was told it is not dangerous she was given beta-blocker I suspect it was supraventricular tachycardia and she kind of recall the name she tells me she was never told about atrial fibrillation.  Obviously will try to contact her primary care physician office and get record of her documented arrhythmias.  A part of evaluation should include echocardiogram as well as stress test which will be done because of atypical chest pain that she experienced. 3. Dyslipidemia on appropriate medication which I will continue rate now in the future may augment his therapy. 4. Snoring I will ask her to think about sleep study.  Her husband does have it and she thinks  about that.   Medication Adjustments/Labs and Tests Ordered: Current medicines are reviewed at length with the patient today.  Concerns regarding medicines are outlined above.  Orders Placed This Encounter  Procedures  . MYOCARDIAL PERFUSION IMAGING  . EKG 12-Lead  . ECHOCARDIOGRAM COMPLETE   No orders of the defined types were placed in this encounter.   Signed, Park Liter, MD, [facility]. 09/07/2018 11:59 AM    [place]

## 2018-09-30 ENCOUNTER — Telehealth (HOSPITAL_COMMUNITY): Payer: Self-pay | Admitting: *Deleted

## 2018-09-30 NOTE — Telephone Encounter (Signed)
Patient given detailed instructions per Myocardial Perfusion Study Information Sheet for the test on 10/05/18. Patient notified to arrive 15 minutes early and that it is imperative to arrive on time for appointment to keep from having the test rescheduled.  If you need to cancel or reschedule your appointment, please call the office within 24 hours of your appointment. . Patient verbalized understanding. Kirstie Peri

## 2018-10-05 ENCOUNTER — Ambulatory Visit (INDEPENDENT_AMBULATORY_CARE_PROVIDER_SITE_OTHER): Payer: PRIVATE HEALTH INSURANCE

## 2018-10-05 VITALS — Ht 62.0 in | Wt 267.0 lb

## 2018-10-05 DIAGNOSIS — R079 Chest pain, unspecified: Secondary | ICD-10-CM

## 2018-10-05 DIAGNOSIS — R002 Palpitations: Secondary | ICD-10-CM

## 2018-10-05 DIAGNOSIS — R0789 Other chest pain: Secondary | ICD-10-CM

## 2018-10-05 MED ORDER — TECHNETIUM TC 99M TETROFOSMIN IV KIT
33.0000 | PACK | Freq: Once | INTRAVENOUS | Status: AC | PRN
  Administered 2018-10-05: 33 via INTRAVENOUS

## 2018-10-06 ENCOUNTER — Ambulatory Visit: Payer: PRIVATE HEALTH INSURANCE

## 2018-10-06 MED ORDER — TECHNETIUM TC 99M TETROFOSMIN IV KIT
32.5000 | PACK | Freq: Once | INTRAVENOUS | Status: AC | PRN
  Administered 2018-10-06: 32.5 via INTRAVENOUS

## 2018-10-11 ENCOUNTER — Ambulatory Visit: Payer: PRIVATE HEALTH INSURANCE | Admitting: Cardiology

## 2018-10-12 LAB — MYOCARDIAL PERFUSION IMAGING
CHL CUP MPHR: 168 {beats}/min
CSEPED: 4 min
CSEPEW: 6.4 METS
Exercise duration (sec): 30 s
LV dias vol: 83 mL (ref 46–106)
LVSYSVOL: 31 mL
Peak HR: 162 {beats}/min
Percent HR: 96 %
Rest HR: 82 {beats}/min
SDS: 4
SRS: 0
SSS: 4
TID: 1.07

## 2018-10-26 ENCOUNTER — Ambulatory Visit (INDEPENDENT_AMBULATORY_CARE_PROVIDER_SITE_OTHER): Payer: PRIVATE HEALTH INSURANCE

## 2018-10-26 DIAGNOSIS — R079 Chest pain, unspecified: Secondary | ICD-10-CM | POA: Diagnosis not present

## 2018-10-26 NOTE — Progress Notes (Signed)
Complete echocardiogram has been performed.  Jimmy Dariel Pellecchia RDCS, RVT 

## 2018-11-03 ENCOUNTER — Ambulatory Visit (INDEPENDENT_AMBULATORY_CARE_PROVIDER_SITE_OTHER): Payer: PRIVATE HEALTH INSURANCE | Admitting: Cardiology

## 2018-11-03 ENCOUNTER — Encounter: Payer: Self-pay | Admitting: Cardiology

## 2018-11-03 VITALS — BP 130/70 | HR 74 | Ht 62.0 in | Wt 264.4 lb

## 2018-11-03 DIAGNOSIS — R002 Palpitations: Secondary | ICD-10-CM | POA: Diagnosis not present

## 2018-11-03 DIAGNOSIS — R0789 Other chest pain: Secondary | ICD-10-CM | POA: Diagnosis not present

## 2018-11-03 DIAGNOSIS — K2100 Gastro-esophageal reflux disease with esophagitis, without bleeding: Secondary | ICD-10-CM

## 2018-11-03 LAB — HEPATIC FUNCTION PANEL
ALK PHOS: 99 IU/L (ref 39–117)
ALT: 33 IU/L — AB (ref 0–32)
AST: 25 IU/L (ref 0–40)
Albumin: 4.1 g/dL (ref 3.5–5.5)
Bilirubin Total: 0.3 mg/dL (ref 0.0–1.2)
Bilirubin, Direct: 0.11 mg/dL (ref 0.00–0.40)
Total Protein: 6.7 g/dL (ref 6.0–8.5)

## 2018-11-03 LAB — LIPID PANEL
Chol/HDL Ratio: 3.4 ratio (ref 0.0–4.4)
Cholesterol, Total: 143 mg/dL (ref 100–199)
HDL: 42 mg/dL (ref >39)
LDL Calculated: 84 mg/dL (ref 0–99)
Triglycerides: 83 mg/dL (ref 0–149)
VLDL Cholesterol Cal: 17 mg/dL (ref 5–40)

## 2018-11-03 NOTE — Patient Instructions (Signed)
Medication Instructions:  Your physician recommends that you continue on your current medications as directed. Please refer to the Current Medication list given to you today.  If you need a refill on your cardiac medications before your next appointment, please call your pharmacy.   Lab work: Your physician recommends that you return for lab work today: Lft, lipids   If you have labs (blood work) drawn today and your tests are completely normal, you will receive your results only by: Marland Kitchen MyChart Message (if you have MyChart) OR . A paper copy in the mail If you have any lab test that is abnormal or we need to change your treatment, we will call you to review the results.  Testing/Procedures: None.   Follow-Up: At [facility], you and your health needs are our priority.  As part of our continuing mission to provide you with exceptional heart care, we have created designated Provider Care Teams.  These Care Teams include your primary Cardiologist (physician) and Advanced Practice Providers (APPs -  Physician Assistants and Nurse Practitioners) who all work together to provide you with the care you need, when you need it. You will need a follow up appointment in 3 months.  Please call our office 2 months in advance to schedule this appointment.  You may see Jenne Campus, MD or another member of our [place] Provider Team in [place]: Shirlee More, MD . Jyl Heinz, MD  Any Other Special Instructions Will Be Listed Below (If Applicable).

## 2018-11-03 NOTE — Progress Notes (Signed)
Cardiology Office Note:    Date:  11/03/2018   ID:  Stanisha Lorenz, DOB [birth year], MRN [id number]  PCP:  Myrlene Broker, MD  Cardiologist:  Jenne Campus, MD    Referring MD: Myrlene Broker, MD   Chief Complaint  Patient presents with  . Follow up on Echo  Doing better  History of Present Illness:    Anne Lawrence is a 52 y.o. female with multiple medical problems that include morbid obesity, diabetes.  She was referred to me because of atypical chest pain.  She did have a stress test stress test was somewhat suboptimal in quality because of her heart rate showed fixed defect likely no segmental motion abnormalities.  Minimal differences between defect at rest and stress.  However she became asymptomatic denies have any chest pain tightness squeezing pressure [place].  Yesterday she had to go with her son to the hospital she have to climb stairs became somewhat short of breath but no chest pain tightness squeezing pressure branches.  I think the key point now will be to modify her risk factors for coronary artery disease.  Therefore I will ask her to have fasting lipid profile today.  I told her if you have more chest pain she needs to let me know.  Past Medical History:  Diagnosis Date  . Arthritis   . Breast cancer (HCC)    DCIS ER/PR+  . Bronchitis   . Family history of breast cancer   . Family history of colon cancer   . GERD (gastroesophageal reflux disease)   . Headache   . History of kidney stones   . Macromastia   . PONV (postoperative nausea and vomiting)    n/v with [phone]  . Rapid heart beat    takes metoprolol    Past Surgical History:  Procedure Laterality Date  . BACK SURGERY  1998  . BREAST LUMPECTOMY WITH RADIOACTIVE SEED AND SENTINEL LYMPH NODE BIOPSY Left 03/12/2017   Procedure: LEFT BREAST LUMPECTOMY WITH BRACKETED  RADIOACTIVE SEED AND SENTINEL LYMPH NODE BIOPSY;  Surgeon: Rolm Bookbinder, MD;  Location: [facility];  Service: General;   Laterality: Left;  . BREAST REDUCTION SURGERY Bilateral 03/20/2017   Procedure: BILATERAL ONCOPLASTIC BREAST REDUCTION;  Surgeon: Irene Limbo, MD;  Location: [facility];  Service: Plastics;  Laterality: Bilateral;  . CHOLECYSTECTOMY  1998  . LIPOMA EXCISION Right 04/2016   shoulder blade area  . WISDOM TOOTH EXTRACTION      Current Medications: Current Meds  Medication Sig  . acetaminophen (TYLENOL) 500 MG tablet Take 500 mg by mouth every 6 (six) hours as needed.  Marland Kitchen anastrozole (ARIMIDEX) 1 MG tablet Take 1 mg by mouth daily.  . Carboxymethylcellul-Glycerin (LUBRICATING EYE DROPS OP) Apply 1 drop to eye daily as needed (dry eyes).  . Chlorpheniramine-PSE-Ibuprofen (ADVIL ALLERGY SINUS PO) Take 1 tablet by mouth daily as needed (allergies).  . cholestyramine (QUESTRAN) 4 g packet Take 1 g by mouth daily.  . Cyanocobalamin (B-12 COMPLIANCE INJECTION IJ) Inject as directed every 30 (thirty) days.  Marland Kitchen esomeprazole (NEXIUM) 40 MG capsule Take 40 mg by mouth daily as needed (acid reflux).  Marland Kitchen ibuprofen (ADVIL,MOTRIN) 200 MG tablet Take 200 mg by mouth every 6 (six) hours as needed (takes 3 tablets).  Marland Kitchen levocetirizine (XYZAL) 5 MG tablet Take 5 mg by mouth daily as needed for allergies.  . metoprolol succinate (TOPROL-XL) 25 MG 24 hr tablet Take 25 mg by mouth at bedtime.     Allergies:   Sulfamethoxazole-trimethoprim  Social History   Socioeconomic History  . Marital status: Married    Spouse name: Not on file  . Number of children: Not on file  . Years of education: Not on file  . Highest education level: Not on file  Occupational History  . Not on file  Social Needs  . Financial resource strain: Not on file  . Food insecurity:    Worry: Not on file    Inability: Not on file  . Transportation needs:    Medical: Not on file    Non-medical: Not on file  Tobacco Use  . Smoking status: Never Smoker  . Smokeless tobacco: Never Used  Substance and Sexual Activity  . Alcohol use:  No  . Drug use: No  . Sexual activity: Not on file  Lifestyle  . Physical activity:    Days per week: Not on file    Minutes per session: Not on file  . Stress: Not on file  Relationships  . Social connections:    Talks on phone: Not on file    Gets together: Not on file    Attends religious service: Not on file    Active member of club or organization: Not on file    Attends meetings of clubs or organizations: Not on file    Relationship status: Not on file  Other Topics Concern  . Not on file  Social History Narrative  . Not on file     Family History: The patient's family history includes Breast cancer in her cousin and cousin; Breast cancer (age of onset: 76) in her mother; COPD in her paternal grandfather; Colon cancer in her maternal grandmother; Colon cancer (age of onset: 43) in her brother; Congestive Heart Failure in her maternal aunt and maternal uncle; Heart attack in her father and maternal grandfather; Leukemia in her maternal aunt; Lung cancer in her maternal aunt; Skin cancer in her paternal aunt and paternal uncle; Stroke in her paternal grandmother. ROS:   Please see the history of present illness.    All 14 point review of systems negative except as described per history of present illness  EKGs/Labs/Other Studies Reviewed:      Recent Labs: No results found for requested labs within last 8760 hours.  Recent Lipid Panel No results found for: CHOL, TRIG, HDL, CHOLHDL, VLDL, LDLCALC, LDLDIRECT  Physical Exam:    VS:  BP 130/70   Pulse 74   Ht 5\' 2"  (1.575 m)   Wt 264 lb 6.4 oz (119.9 kg)   SpO2 97%   BMI 48.36 kg/m     Wt Readings from Last 3 Encounters:  11/03/18 264 lb 6.4 oz (119.9 kg)  10/05/18 267 lb (121.1 kg)  09/07/18 267 lb (121.1 kg)     GEN:  Well nourished, well developed in no acute distress HEENT: Normal NECK: No JVD; No carotid bruits LYMPHATICS: No lymphadenopathy CARDIAC: RRR, no murmurs, no rubs, no gallops RESPIRATORY:   Clear to auscultation without rales, wheezing or rhonchi  ABDOMEN: Soft, non-tender, non-distended MUSCULOSKELETAL:  No edema; No deformity  SKIN: Warm and dry LOWER EXTREMITIES: no swelling NEUROLOGIC:  Alert and oriented x 3 PSYCHIATRIC:  Normal affect   ASSESSMENT:    1. Atypical chest pain   2. Gastroesophageal reflux disease with esophagitis   3. Palpitations    PLAN:    In order of problems listed above:  1. Atypical chest pain denies having any stress test throat grossly no evidence of ischemia. 2. Gastroesophageal reflux  disease most likely responsible for her symptomatology.  She need to take proton pump inhibitor on a regular basis which she will. 3. Palpitations denies having any. 4. Risk factors modifications have asked him to have fasting lipid profile done today.   Medication Adjustments/Labs and Tests Ordered: Current medicines are reviewed at length with the patient today.  Concerns regarding medicines are outlined above.  No orders of the defined types were placed in this encounter.  Medication changes: No orders of the defined types were placed in this encounter.   Signed, Park Liter, MD, [facility] 11/03/2018 10:10 AM    [place]

## 2018-12-29 ENCOUNTER — Other Ambulatory Visit: Payer: Self-pay | Admitting: General Surgery

## 2018-12-29 ENCOUNTER — Telehealth: Payer: Self-pay | Admitting: Gastroenterology

## 2018-12-29 DIAGNOSIS — Z853 Personal history of malignant neoplasm of breast: Secondary | ICD-10-CM

## 2018-12-29 NOTE — Telephone Encounter (Signed)
Pt needs refill of the medication she is sch to see dr.gupta 01/17/2019 @11am 

## 2018-12-29 NOTE — Telephone Encounter (Signed)
Pt will like for the refill to be sent to [facility]  [address], [place], [place] [address] [phone]

## 2018-12-30 MED ORDER — CHOLESTYRAMINE 4 G PO PACK: 4.0000 g | PACK | Freq: Every day | ORAL | 0 refills | Status: DC

## 2018-12-30 NOTE — Telephone Encounter (Signed)
Sent refill to patients pharmacy. 

## 2019-01-14 ENCOUNTER — Encounter: Payer: Self-pay | Admitting: Gastroenterology

## 2019-01-17 ENCOUNTER — Ambulatory Visit: Payer: PRIVATE HEALTH INSURANCE | Admitting: Gastroenterology

## 2019-01-17 ENCOUNTER — Encounter: Payer: Self-pay | Admitting: Gastroenterology

## 2019-01-17 VITALS — BP 130/84 | HR 80 | Ht 62.0 in | Wt 262.0 lb

## 2019-01-17 DIAGNOSIS — Z800 Family history of malignant neoplasm of digestive organs: Secondary | ICD-10-CM

## 2019-01-17 DIAGNOSIS — K580 Irritable bowel syndrome with diarrhea: Secondary | ICD-10-CM | POA: Diagnosis not present

## 2019-01-17 MED ORDER — SOD PICOSULFATE-MAG OX-CIT ACD 10-3.5-12 MG-GM -GM/160ML PO SOLN: 1.0000 | Freq: Once | ORAL | 0 refills | Status: AC

## 2019-01-17 MED ORDER — SOD PICOSULFATE-MAG OX-CIT ACD 10-3.5-12 MG-GM -GM/160ML PO SOLN: 1.0000 | Freq: Once | ORAL | 0 refills | Status: DC

## 2019-01-17 MED ORDER — CHOLESTYRAMINE 4 G PO PACK: 4.0000 g | PACK | Freq: Every day | ORAL | 11 refills | Status: DC

## 2019-01-17 NOTE — Patient Instructions (Signed)
If you are age 53 or older, your body mass index should be between 23-30. Your Body mass index is 47.92 kg/m. If this is out of the aforementioned range listed, please consider follow up with your Primary Care Provider.  If you are age 19 or younger, your body mass index should be between 19-25. Your Body mass index is 47.92 kg/m. If this is out of the aformentioned range listed, please consider follow up with your Primary Care Provider.    We have sent the following medications to your pharmacy for you to pick up at your convenience: Cholestyramine  Clenpiq  You have been scheduled for a colonoscopy. Please follow written instructions given to you at your visit today.  Please pick up your prep supplies at the pharmacy within the next 1-3 days. If you use inhalers (even only as needed), please bring them with you on the day of your procedure. Your physician has requested that you go to www.startemmi.com and enter the access code given to you at your visit today. This web site gives a general overview about your procedure. However, you should still follow specific instructions given to you by our office regarding your preparation for the procedure.   Thank you,  Dr. Jackquline Denmark

## 2019-01-17 NOTE — Progress Notes (Signed)
Chief Complaint: FU  Referring Provider:  Myrlene Broker, MD      ASSESSMENT AND PLAN;   #1. GERD  #2. FH colon cancer (brother at age 53 but stage IV, maternal grandmother)  #3. IBS with diarrhea (post-chole diarrhea). No IBD.  Had positive serology for IBD in the past but negative extensive GI work-up for Crohn's.  Plan: - Proceed with colonoscopy. Discussed risks & benefits. (Risks including rare perforation req laparotomy, bleeding after biopsies/polypectomy req blood transfusion, rare chance of missing neoplasms, risks of anesthesia/sedation). Benefits outweigh the risks. Patient agrees to proceed. All the questions were answered. Consent forms given for review. - Nexium 40mg  po qod. - Continue cholestyamine 4g po qd #30, 11 refills.    HPI:    Anne Lawrence is a 53 y.o. female  For follow-up visit No new complaints Intermittent diarrhea which is better with cholestyramine. Denies having any nausea, omitting, heartburn, odynophagia or dysphagia. No fever or chills. No significant weight loss.  Past GI procedures: -Colonoscopy 10/23/2016 (adult)-mild pancolonic diverticulosis, otherwise normal to TI.  No Crohn's disease.  Negative random colonic biopsies for microscopic colitis. -EGD 10/23/2016 mild gastritis, status post empiric esophageal dilatation.  Negative small bowel biopsies for celiac.  Negative esophageal biopsies for EOE. Past Medical History:  Diagnosis Date  . Arthritis   . 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 Bronchitis   . Family history of breast cancer   . Family history of colon cancer   . GERD (gastroesophageal reflux disease)   . Headache   . History of kidney stones   . Macromastia   . PONV (postoperative nausea and vomiting)    n/v with [phone]  . Rapid heart beat    takes metoprolol    Past Surgical History:  Procedure Laterality Date  . BACK SURGERY  1998  . BREAST LUMPECTOMY WITH RADIOACTIVE SEED AND SENTINEL  LYMPH NODE BIOPSY Left 03/12/2017   Procedure: LEFT BREAST LUMPECTOMY WITH BRACKETED  RADIOACTIVE SEED AND SENTINEL LYMPH NODE BIOPSY;  Surgeon: Rolm Bookbinder, MD;  Location: [facility];  Service: General;  Laterality: Left;  . BREAST REDUCTION SURGERY Bilateral 03/20/2017   Procedure: BILATERAL ONCOPLASTIC BREAST REDUCTION;  Surgeon: Irene Limbo, MD;  Location: [facility];  Service: Plastics;  Laterality: Bilateral;  . CHOLECYSTECTOMY  1998  . COLONOSCOPY  10/23/2016   Mild pancolonic diverticulosis.   Marland Kitchen LIPOMA EXCISION Right 04/2016   shoulder blade area  . WISDOM TOOTH EXTRACTION      Family History  Problem Relation Age of Onset  . Breast cancer Mother 87  . Heart attack Father   . Lung cancer Maternal Aunt   . Congestive Heart Failure Maternal Uncle   . Skin cancer Paternal Aunt        possible melanoma  . Skin cancer Paternal Uncle        possible melanoma  . Colon cancer Maternal Grandmother        dx in her 85s  . Heart attack Maternal Grandfather   . Stroke Paternal Grandmother   . COPD Paternal Grandfather   . Colon cancer Brother 92       died at 3  . Leukemia Maternal Aunt   . Congestive Heart Failure Maternal Aunt   . Breast cancer Cousin        dx in he r30s  . Breast cancer Cousin        dx in her 30s-40s  . Esophageal cancer Neg Hx  Social History   Tobacco Use  . Smoking status: Never Smoker  . Smokeless tobacco: Never Used  Substance Use Topics  . Alcohol use: No  . Drug use: No    Current Outpatient Medications  Medication Sig Dispense Refill  . acetaminophen (TYLENOL) 500 MG tablet Take 500 mg by mouth every 6 (six) hours as needed.    Marland Kitchen anastrozole (ARIMIDEX) 1 MG tablet Take 1 mg by mouth daily.    . Carboxymethylcellul-Glycerin (LUBRICATING EYE DROPS OP) Apply 1 drop to eye daily as needed (dry eyes).    . Chlorpheniramine-PSE-Ibuprofen (ADVIL ALLERGY SINUS PO) Take 1 tablet by mouth daily as needed (allergies).    . cholestyramine  (QUESTRAN) 4 g packet Take 1 packet (4 g total) by mouth daily. (Patient taking differently: Take 2 g by mouth daily. ) 30 each 0  . Cyanocobalamin (B-12 COMPLIANCE INJECTION IJ) Inject as directed every 30 (thirty) days.    Marland Kitchen esomeprazole (NEXIUM) 40 MG capsule Take 40 mg by mouth daily as needed (acid reflux).    Marland Kitchen ibuprofen (ADVIL,MOTRIN) 200 MG tablet Take 200 mg by mouth every 6 (six) hours as needed (takes 3 tablets).    Marland Kitchen levocetirizine (XYZAL) 5 MG tablet Take 5 mg by mouth daily as needed for allergies.    . metoprolol succinate (TOPROL-XL) 25 MG 24 hr tablet Take 25 mg by mouth at bedtime.     No current facility-administered medications for this visit.     Allergies  Allergen Reactions  . Sulfamethoxazole-Trimethoprim Other (See Comments)    Severe headache    Review of Systems:  Constitutional: Denies fever, chills, diaphoresis, appetite change and fatigue.  HEENT: Denies photophobia, eye pain, redness, hearing loss, ear pain, congestion, sore throat, rhinorrhea, sneezing, mouth sores, neck pain, neck stiffness and tinnitus.   Respiratory: Denies SOB, DOE, cough, chest tightness,  and wheezing.   Cardiovascular: Denies chest pain, palpitations and leg swelling.  Genitourinary: Denies dysuria, urgency, frequency, hematuria, flank pain and difficulty urinating.  Musculoskeletal: Denies myalgias, back pain, joint swelling, arthralgias and gait problem.  Skin: No rash.  Neurological: Denies dizziness, seizures, syncope, weakness, light-headedness, numbness and headaches.  Hematological: Denies adenopathy. Easy bruising, personal or family bleeding history  Psychiatric/Behavioral: has anxiety or depression     Physical Exam:    BP 130/84   Pulse 80   Ht 5\' 2"  (1.575 m)   Wt 262 lb (118.8 kg)   BMI 47.92 kg/m  Filed Weights   01/17/19 1051  Weight: 262 lb (118.8 kg)   Constitutional:  Well-developed, in no acute distress. Psychiatric: Normal mood and affect. Behavior  is normal. HEENT: Pupils normal.  Conjunctivae are normal. No scleral icterus. Neck supple.  Cardiovascular: Normal rate, regular rhythm. No edema Pulmonary/chest: Effort normal and breath sounds normal. No wheezing, rales or rhonchi. Abdominal: Soft, nondistended. Nontender. Bowel sounds active throughout. There are no masses palpable. No hepatomegaly. Rectal:  defered Neurological: Alert and oriented to person place and time. Skin: Skin is warm and dry. No rashes noted.  Data Reviewed: I have personally reviewed following labs and imaging studies  CBC: CBC Latest Ref Rng & Units 03/20/2017 03/05/2017  WBC 4.0 - 10.5 K/uL 10.8(H) 10.7(H)  Hemoglobin 12.0 - 15.0 g/dL 12.4 13.1  Hematocrit 36.0 - 46.0 % 38.2 38.9  Platelets 150 - 400 K/uL 250 206    CMP: CMP Latest Ref Rng & Units 11/03/2018 03/20/2017 03/05/2017  Glucose 65 - 99 mg/dL - 117(H) 130(H)  BUN 6 -  20 mg/dL - 12 13  Creatinine 0.44 - 1.00 mg/dL - 0.62 0.75  Sodium 135 - 145 mmol/L - 139 140  Potassium 3.5 - 5.1 mmol/L - 3.7 3.6  Chloride 101 - 111 mmol/L - 107 109  CO2 22 - 32 mmol/L - 22 23  Calcium 8.9 - 10.3 mg/dL - 8.7(L) 8.9  Total Protein 6.0 - 8.5 g/dL 6.7 - -  Total Bilirubin 0.0 - 1.2 mg/dL 0.3 - -  Alkaline Phos 39 - 117 IU/L 99 - -  AST 0 - 40 IU/L 25 - -  ALT 0 - 32 IU/L 33(H) - -  25 minutes spent with the patient today. Greater than 50% was spent in counseling and coordination of care with the patient     Carmell Austria, MD 01/17/2019, 11:15 AM  Cc: Myrlene Broker, MD

## 2019-01-21 ENCOUNTER — Telehealth: Payer: Self-pay | Admitting: Gastroenterology

## 2019-01-21 NOTE — Telephone Encounter (Signed)
Patient was informed, voiced understanding.

## 2019-01-21 NOTE — Telephone Encounter (Signed)
Pt sched for colon 3.6.20.  She stated that she takes cholestyramine QAM for irritable bowel and that she mixes it with flavoured water.  Pt would like to know if she can take this the day before procedure.

## 2019-01-21 NOTE — Telephone Encounter (Signed)
She can take it the day before

## 2019-01-21 NOTE — Telephone Encounter (Signed)
Please advise 

## 2019-01-24 ENCOUNTER — Telehealth: Payer: Self-pay | Admitting: Gastroenterology

## 2019-01-24 NOTE — Telephone Encounter (Signed)
Pt called and was given Dr. Steve Rattler advise. Pt decided to r/s colonoscopy to 4/6 at 8:00am.

## 2019-01-24 NOTE — Telephone Encounter (Signed)
If she is symptomatic and still having any problems related to gallbladder, it will be a good idea to reschedule colonoscopy.

## 2019-01-24 NOTE — Telephone Encounter (Signed)
Pt passed a gallstone this weekend and as a consequence she developed an infection and is taking medication for that . She is scheduled for a colon with Dr. Lyndel Safe this Friday and wants to know if she needs to r/s.

## 2019-01-24 NOTE — Telephone Encounter (Signed)
RN has not contacted this patient-does this patient need to reschedule her procedure with you?

## 2019-01-25 NOTE — Progress Notes (Signed)
Called and spoke with patient- patient already knows about the change of procedure date to 02/28/2019 at 8:00am; patient verbalized understanding of information/instructions and states she will call the office back if she has any questions/concerns; patient had already been explained to that the dates would change on her instructions and understands to call office if she does not understand; Patient verbalized understanding of information/instructions; Patient was advised to call back if questions/concerns arise;

## 2019-01-27 DIAGNOSIS — Z853 Personal history of malignant neoplasm of breast: Secondary | ICD-10-CM

## 2019-01-28 ENCOUNTER — Encounter: Payer: PRIVATE HEALTH INSURANCE | Admitting: Gastroenterology

## 2019-02-09 ENCOUNTER — Ambulatory Visit
Admission: RE | Admit: 2019-02-09 | Discharge: 2019-02-09 | Disposition: A | Discharge status: Home / Self Care | Payer: PRIVATE HEALTH INSURANCE | Source: Ambulatory Visit | Attending: General Surgery | Admitting: General Surgery

## 2019-02-09 ENCOUNTER — Other Ambulatory Visit: Payer: Self-pay

## 2019-02-09 ENCOUNTER — Other Ambulatory Visit: Payer: Self-pay | Admitting: General Surgery

## 2019-02-09 DIAGNOSIS — Z853 Personal history of malignant neoplasm of breast: Secondary | ICD-10-CM

## 2019-02-09 DIAGNOSIS — R921 Mammographic calcification found on diagnostic imaging of breast: Secondary | ICD-10-CM

## 2019-02-09 HISTORY — DX: Personal history of irradiation: Z92.3

## 2019-02-10 ENCOUNTER — Ambulatory Visit
Admission: RE | Admit: 2019-02-10 | Discharge: 2019-02-10 | Disposition: A | Discharge status: Home / Self Care | Payer: PRIVATE HEALTH INSURANCE | Source: Ambulatory Visit | Attending: General Surgery | Admitting: General Surgery

## 2019-02-10 DIAGNOSIS — R921 Mammographic calcification found on diagnostic imaging of breast: Secondary | ICD-10-CM

## 2019-02-28 ENCOUNTER — Encounter: Payer: PRIVATE HEALTH INSURANCE | Admitting: Gastroenterology

## 2019-03-11 ENCOUNTER — Telehealth: Payer: Self-pay | Admitting: Cardiology

## 2019-03-11 NOTE — Telephone Encounter (Signed)
Virtual Visit Pre-Appointment Phone Call  Steps For Call:  1. Confirm consent - "In the setting of the current Covid19 crisis, you are scheduled for a (phone or video) visit with your provider on (date) at (time).  Just as we do with many in-office visits, in order for you to participate in this visit, we must obtain consent.  If you'd like, I can send this to your mychart (if signed up) or email for you to review.  Otherwise, I can obtain your verbal consent now.  All virtual visits are billed to your insurance company just like a normal visit would be.  By agreeing to a virtual visit, we'd like you to understand that the technology does not allow for your provider to perform an examination, and thus may limit your provider's ability to fully assess your condition.  Finally, though the technology is pretty good, we cannot assure that it will always work on either your or our end, and in the setting of a video visit, we may have to convert it to a phone-only visit.  In either situation, we cannot ensure that we have a secure connection.  Are you willing to proceed?" STAFF: Did the patient verbally acknowledge consent to telehealth visit? Document YES/NO here: YES  2. Confirm the BEST phone number to call the day of the visit by including in appointment notes  3. Give patient instructions for WebEx/MyChart download to smartphone as below or Doximity/Doxy.me if video visit (depending on what platform provider is using)  4. Advise patient to be prepared with their blood pressure, heart rate, weight, any heart rhythm information, their current medicines, and a piece of paper and pen handy for any instructions they may receive the day of their visit  5. Inform patient they will receive a phone call 15 minutes prior to their appointment time (may be from unknown caller ID) so they should be prepared to answer  6. Confirm that appointment type is correct in Epic appointment notes (VIDEO vs  PHONE)     TELEPHONE CALL NOTE  Anne Lawrence has been deemed a candidate for a follow-up tele-health visit to limit community exposure during the Covid-19 pandemic. I spoke with the patient via phone to ensure availability of phone/video source, confirm preferred email & phone number, and discuss instructions and expectations.  I reminded Anne Lawrence to be prepared with any vital sign and/or heart rhythm information that could potentially be obtained via home monitoring, at the time of her visit. I reminded Anne Lawrence to expect a phone call at the time of her visit if her visit.  [name] 03/11/2019 9:49 AM   INSTRUCTIONS FOR DOWNLOADING THE WEBEX APP TO SMARTPHONE  - If Apple, ask patient to go to App Store and type in WebEx in the search bar. [place] [employer], the blue/green circle. If Android, go to [employer] and type in [employer] in the search bar. The app is free but as with any other app downloads, their phone may require them to verify saved payment information or Apple/Android password.  - The patient does NOT have to create an account. - On the day of the visit, the assist will walk the patient through joining the meeting with the meeting number/password.  INSTRUCTIONS FOR DOWNLOADING THE MYCHART APP TO SMARTPHONE  - The patient must first make sure to have activated MyChart and know their login information - If Apple, go to [employer] and type in MyChart in the  search bar and download the app. If Android, ask patient to go to [employer] and type in [employer] in the search bar and download the app. The app is free but as with any other app downloads, their phone may require them to verify saved payment information or Apple/Android password.  - The patient will need to then log into the app with their MyChart username and password, and select [facility] as their healthcare provider to link the account. When it is time for your visit, go to the  MyChart app, find appointments, and click Begin Video Visit. Be sure to Select Allow for your device to access the Microphone and Camera for your visit. You will then be connected, and your provider will be with you shortly.  **If they have any issues connecting, or need assistance please contact MyChart service desk [phone])**  **If using a computer, in order to ensure the best quality for their visit they will need to use either of the following Internet Browsers: [employer], or Google Chrome**  IF USING DOXIMITY or DOXY.ME - The patient will receive a link just prior to their visit, either by text or email (to be determined day of appointment depending on if it's doxy.me or Doximity).     FULL LENGTH CONSENT FOR TELE-HEALTH VISIT   I hereby voluntarily request, consent and authorize [facility] and its employed or contracted physicians, physician assistants, nurse practitioners or other licensed health care professionals (the Practitioner), to provide me with telemedicine health care services (the Services") as deemed necessary by the treating Practitioner. I acknowledge and consent to receive the Services by the Practitioner via telemedicine. I understand that the telemedicine visit will involve communicating with the Practitioner through live audiovisual communication technology and the disclosure of certain medical information by electronic transmission. I acknowledge that I have been given the opportunity to request an in-person assessment or other available alternative prior to the telemedicine visit and am voluntarily participating in the telemedicine visit.  I understand that I have the right to withhold or withdraw my consent to the use of telemedicine in the course of my care at any time, without affecting my right to future care or treatment, and that the Practitioner or I may terminate the telemedicine visit at any time. I understand that I have the right to  inspect all information obtained and/or recorded in the course of the telemedicine visit and may receive copies of available information for a reasonable fee.  I understand that some of the potential risks of receiving the Services via telemedicine include:   Delay or interruption in medical evaluation due to technological equipment failure or disruption;  Information transmitted may not be sufficient (e.g. poor resolution of images) to allow for appropriate medical decision making by the Practitioner; and/or   In rare instances, security protocols could fail, causing a breach of personal health information.  Furthermore, I acknowledge that it is my responsibility to provide information about my medical history, conditions and care that is complete and accurate to the best of my ability. I acknowledge that Practitioner's advice, recommendations, and/or decision may be based on factors not within their control, such as incomplete or inaccurate data provided by me or distortions of diagnostic images or specimens that may result from electronic transmissions. I understand that the practice of medicine is not an exact science and that Practitioner makes no warranties or guarantees regarding treatment outcomes. I acknowledge that I will receive a copy of this consent  concurrently upon execution via email to the email address I last provided but may also request a printed copy by calling the office of [facility].    I understand that my insurance will be billed for this visit.   I have read or had this consent read to me.  I understand the contents of this consent, which adequately explains the benefits and risks of the Services being provided via telemedicine.   I have been provided ample opportunity to ask questions regarding this consent and the Services and have had my questions answered to my satisfaction.  I give my informed consent for the services to be provided through the use of  telemedicine in my medical care  By participating in this telemedicine visit I agree to the above.

## 2019-03-15 ENCOUNTER — Telehealth: Payer: PRIVATE HEALTH INSURANCE | Admitting: Cardiology

## 2019-03-28 ENCOUNTER — Other Ambulatory Visit: Payer: Self-pay

## 2019-03-28 ENCOUNTER — Telehealth (INDEPENDENT_AMBULATORY_CARE_PROVIDER_SITE_OTHER): Payer: PRIVATE HEALTH INSURANCE | Admitting: Cardiology

## 2019-03-28 ENCOUNTER — Encounter: Payer: Self-pay | Admitting: Cardiology

## 2019-03-28 VITALS — BP 138/81 | HR 79

## 2019-03-28 DIAGNOSIS — R002 Palpitations: Secondary | ICD-10-CM

## 2019-03-28 DIAGNOSIS — R0789 Other chest pain: Secondary | ICD-10-CM

## 2019-03-28 DIAGNOSIS — K2100 Gastro-esophageal reflux disease with esophagitis, without bleeding: Secondary | ICD-10-CM

## 2019-03-28 DIAGNOSIS — R079 Chest pain, unspecified: Secondary | ICD-10-CM

## 2019-03-28 DIAGNOSIS — F419 Anxiety disorder, unspecified: Secondary | ICD-10-CM

## 2019-03-28 NOTE — Progress Notes (Signed)
Virtual Visit via Video Note   This visit type was conducted due to national recommendations for restrictions regarding the COVID-19 Pandemic (e.g. social distancing) in an effort to limit this patient's exposure and mitigate transmission in our community.  Due to her co-morbid illnesses, this patient is at least at moderate risk for complications without adequate follow up.  This format is felt to be most appropriate for this patient at this time.  All issues noted in this document were discussed and addressed.  A limited physical exam was performed with this format.  Please refer to the patient's chart for her consent to telehealth for [facility].  Evaluation Performed:  Follow-up visit  This visit type was conducted due to national recommendations for restrictions regarding the COVID-19 Pandemic (e.g. social distancing).  This format is felt to be most appropriate for this patient at this time.  All issues noted in this document were discussed and addressed.  No physical exam was performed (except for noted visual exam findings with Video Visits).  Please refer to the patient's chart (MyChart message for video visits and phone note for telephone visits) for the patient's consent to telehealth for [facility].  Date:  03/28/2019  ID: Anne Lawrence, DOB [birth year], MRN [id number]   Patient Location: [place] [address]   Provider location:   [place] [facility]  PCP:  Myrlene Broker, MD  Cardiologist:  Anne Campus, MD     Chief Complaint: Doing well  History of Present Illness:    Anne Lawrence is a 53 y.o. female  who presents via audio/video conferencing for a telehealth visit today.  I seen her originally because of atypical chest pain.  Overall she is doing well she denies having any chest pain no tightness squeezing pressure burning chest.  We talked a lot about risk factors modifications we will talk about exercises on the regular basis  we discussed the basic of Mediterranean diet as well as intermittent fasting she wants to do that rather than go on a cholesterol-lowering medication last cholesterol check months ago was still acceptable but honestly LDL should be less than 70 in her situation that would be beneficial for her.  I think she would be able to get it by exercises and good diet.   The patient does not have symptoms concerning for COVID-19 infection (fever, chills, cough, or new SHORTNESS OF BREATH).    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+   Bronchitis    Family history of breast cancer    Family history of colon cancer    GERD (gastroesophageal reflux disease)    Headache    History of kidney stones    Macromastia    Personal history of radiation therapy    PONV (postoperative nausea and vomiting)    n/v with [phone]   Rapid heart beat    takes metoprolol    Past Surgical History:  Procedure Laterality Date   BACK SURGERY  1998   BREAST LUMPECTOMY Left 2018   BREAST LUMPECTOMY WITH RADIOACTIVE SEED AND SENTINEL LYMPH NODE BIOPSY Left 03/12/2017   Procedure: LEFT BREAST LUMPECTOMY WITH BRACKETED  RADIOACTIVE SEED AND SENTINEL LYMPH NODE BIOPSY;  Surgeon: Anne Bookbinder, MD;  Location: [place];  Service: General;  Laterality: Left;   BREAST REDUCTION  SURGERY Bilateral 03/20/2017   Procedure: BILATERAL ONCOPLASTIC BREAST REDUCTION;  Surgeon: Anne Limbo, MD;  Location: [place];  Service: Plastics;  Laterality: Bilateral;   CHOLECYSTECTOMY  1998   COLONOSCOPY  10/23/2016   Mild pancolonic diverticulosis.    LIPOMA EXCISION Right 04/2016   shoulder blade area   WISDOM TOOTH EXTRACTION       Current Meds  Medication Sig   acetaminophen (TYLENOL) 500 MG tablet Take 500 mg by mouth every 6 (six) hours as needed.   anastrozole (ARIMIDEX) 1  MG tablet Take 1 mg by mouth daily.   Carboxymethylcellul-Glycerin (LUBRICATING EYE DROPS OP) Apply 1 drop to eye daily as needed (dry eyes).   Chlorpheniramine-PSE-Ibuprofen (ADVIL ALLERGY SINUS PO) Take 1 tablet by mouth daily as needed (allergies).   cholestyramine (QUESTRAN) 4 g packet Take 1 packet (4 g total) by mouth daily.   Cyanocobalamin (B-12 COMPLIANCE INJECTION IJ) Inject as directed every 30 (thirty) days.   esomeprazole (NEXIUM) 40 MG capsule Take 40 mg by mouth daily as needed (acid reflux).   ibuprofen (ADVIL,MOTRIN) 200 MG tablet Take 200 mg by mouth every 6 (six) hours as needed (takes 3 tablets).   levocetirizine (XYZAL) 5 MG tablet Take 5 mg by mouth daily as needed for allergies.   metoprolol succinate (TOPROL-XL) 25 MG 24 hr tablet Take 25 mg by mouth at bedtime.      Family History: The patient's family history includes Breast cancer in her cousin and cousin; Breast cancer (age of onset: 41) in her mother; COPD in her paternal grandfather; Colon cancer in her maternal grandmother; Colon cancer (age of onset: 27) in her brother; Congestive Heart Failure in her maternal aunt and maternal uncle; Heart attack in her father and maternal grandfather; Leukemia in her maternal aunt; Lung cancer in her maternal aunt; Skin cancer in her paternal aunt and paternal uncle; Stroke in her paternal grandmother. There is no history of Esophageal cancer.   ROS:   Please see the history of present illness.     All other systems reviewed and are negative.   Labs/Other Tests and Data Reviewed:     Recent Labs: 11/03/2018: ALT 33  Recent Lipid Panel    Component Value Date/Time   CHOL 143 11/03/2018 1022   TRIG 83 11/03/2018 1022   HDL 42 11/03/2018 1022   CHOLHDL 3.4 11/03/2018 1022   LDLCALC 84 11/03/2018 1022      Exam:    Vital Signs:  BP 138/81    Pulse 79     Wt Readings from Last 3 Encounters:  01/17/19 262 lb (118.8 kg)  11/03/18 264 lb 6.4 oz (119.9 kg)   10/05/18 267 lb (121.1 kg)     Well nourished, well developed in no acute distress. Alert awake and at the time 3.  With talking over the video link she is doing well no JVD no swelling  Diagnosis for this visit:   1. Atypical chest pain   2. Palpitations   3. Anxiety   4. Gastroesophageal reflux disease with esophagitis      ASSESSMENT & PLAN:    1.  Atypical chest pain.  Denies having any we will continue conservative approach 2.  Palpitations denies having any recent. 3.  Anxiety doing well from that point of view. 4.  Gastroesophageal reflux disease stable.  Overall she is doing very well we will continue present management.  COVID-19 Education: The signs and symptoms of COVID-19 were discussed with the patient and how to  seek care for testing (follow up with PCP or arrange E-visit).  The importance of social distancing was discussed today.  Patient Risk:   After full review of this patients clinical status, I feel that they are at least moderate risk at this time.  Time:   Today, I have spent 16 minutes with the patient with telehealth technology discussing pt health issues.  I spent 5 minutes reviewing her chart before the visit.  Visit was finished at 1:29 PM.    Medication Adjustments/Labs and Tests Ordered: Current medicines are reviewed at length with the patient today.  Concerns regarding medicines are outlined above.  No orders of the defined types were placed in this encounter.  Medication changes: No orders of the defined types were placed in this encounter.    Disposition: Follow-up in 5 months we will check cholesterol in 4 months  Signed, Park Liter, MD, [facility] 03/28/2019 1:43 PM    [place]

## 2019-03-28 NOTE — Patient Instructions (Signed)
Medication Instructions:  Your physician recommends that you continue on your current medications as directed. Please refer to the Current Medication list given to you today.  If you need a refill on your cardiac medications before your next appointment, please call your pharmacy.   Lab work: Your physician recommends that you return for lab work in September 2020: Lipids (fasting )  If you have labs (blood work) drawn today and your tests are completely normal, you will receive your results only by: Marland Kitchen MyChart Message (if you have MyChart) OR . A paper copy in the mail If you have any lab test that is abnormal or we need to change your treatment, we will call you to review the results.  Testing/Procedures: None.   Follow-Up: At [facility], you and your health needs are our priority.  As part of our continuing mission to provide you with exceptional heart care, we have created designated Provider Care Teams.  These Care Teams include your primary Cardiologist (physician) and Advanced Practice Providers (APPs -  Physician Assistants and Nurse Practitioners) who all work together to provide you with the care you need, when you need it. You will need a follow up appointment in 5 months.  Please call our office 2 months in advance to schedule this appointment.  You may see Jenne Campus, MD or another member of our [place] Provider Team in [place]: Shirlee More, MD . Jyl Heinz, MD  Any Other Special Instructions Will Be Listed Below (If Applicable).

## 2019-04-21 DIAGNOSIS — M940 Chondrocostal junction syndrome [Tietze]: Secondary | ICD-10-CM

## 2019-04-21 HISTORY — DX: Chondrocostal junction syndrome (tietze): M94.0

## 2019-06-02 DIAGNOSIS — D519 Vitamin B12 deficiency anemia, unspecified: Secondary | ICD-10-CM

## 2019-06-02 DIAGNOSIS — C50812 Malignant neoplasm of overlapping sites of left female breast: Secondary | ICD-10-CM

## 2019-06-02 DIAGNOSIS — D509 Iron deficiency anemia, unspecified: Secondary | ICD-10-CM

## 2019-07-01 ENCOUNTER — Ambulatory Visit: Payer: PRIVATE HEALTH INSURANCE | Admitting: *Deleted

## 2019-07-01 ENCOUNTER — Other Ambulatory Visit: Payer: Self-pay

## 2019-07-01 VITALS — Ht 61.5 in | Wt 260.0 lb

## 2019-07-01 DIAGNOSIS — K580 Irritable bowel syndrome with diarrhea: Secondary | ICD-10-CM

## 2019-07-01 DIAGNOSIS — Z800 Family history of malignant neoplasm of digestive organs: Secondary | ICD-10-CM

## 2019-07-01 MED ORDER — SUPREP BOWEL PREP KIT 17.5-3.13-1.6 GM/177ML PO SOLN: 1.0000 | Freq: Once | ORAL | 0 refills | Status: AC

## 2019-07-01 NOTE — Progress Notes (Signed)
No egg or soy allergy known to patient  No issues with past sedation with any surgeries  or procedures, no intubation problems - PONV per pt-  No diet pills per patient No home 02 use per patient  No blood thinners per patient  Pt denies issues with constipation  No A fib or A flutter  EMMI video sent to pt's e mail   2017 colon pt states she had a lot of pain and cramping post colon-  She states she felt like she had trapped gas - pt instructed she will have to pass gas in the RR   Pt verified name, DOB, address and insurance during PV today. Pt mailed instruction packet to included paper to complete and mail back to [facility] with addressed and stamped envelope, Emmi video, copy of consent form to read and not return, and instructions. . PV completed over the phone. Pt encouraged to call with questions or issues - pt has a Clenpiq at home from 12-2018 OV   Pt is aware that care partner will wait in the car during procedure; if they feel like they will be too hot to wait in the car; they may wait in the lobby.  We want them to wear a mask (we do not have any that we can provide them), practice social distancing, and we will check their temperatures when they get here.  I did remind patient that their care partner needs to stay in the parking lot the entire time. Pt will wear mask into building.

## 2019-07-14 ENCOUNTER — Telehealth: Payer: Self-pay

## 2019-07-14 NOTE — Telephone Encounter (Signed)
Covid-19 screening questions   Do you now or have you had a fever in the last 14 days? NO  Do you have any respiratory symptoms of shortness of breath or cough now or in the last 14 days? NO  Do you have any family members or close contacts with diagnosed or suspected Covid-19 in the past 14 days? NO  Have you been tested for Covid-19 and found to be positive? NO     Confirmed with patient   

## 2019-07-15 ENCOUNTER — Encounter: Payer: Self-pay | Admitting: Gastroenterology

## 2019-07-15 ENCOUNTER — Other Ambulatory Visit: Payer: Self-pay

## 2019-07-15 ENCOUNTER — Ambulatory Visit (AMBULATORY_SURGERY_CENTER): Payer: PRIVATE HEALTH INSURANCE | Admitting: Gastroenterology

## 2019-07-15 VITALS — BP 152/77 | HR 68 | Temp 96.0°F | Resp 19 | Ht 62.0 in | Wt 262.0 lb

## 2019-07-15 DIAGNOSIS — Z800 Family history of malignant neoplasm of digestive organs: Secondary | ICD-10-CM

## 2019-07-15 DIAGNOSIS — Z1211 Encounter for screening for malignant neoplasm of colon: Secondary | ICD-10-CM

## 2019-07-15 MED ORDER — SODIUM CHLORIDE 0.9 % IV SOLN: 500.0000 mL | Freq: Once | INTRAVENOUS | Status: DC

## 2019-07-15 NOTE — Progress Notes (Signed)
A and O x3. Report to RN. Tolerated MAC anesthesia well.

## 2019-07-15 NOTE — Patient Instructions (Signed)
YOU HAD AN ENDOSCOPIC PROCEDURE TODAY AT [facility]:   Refer to the procedure report that was given to you for any specific questions about what was found during the examination.  If the procedure report does not answer your questions, please call your gastroenterologist to clarify.  If you requested that your care partner not be given the details of your procedure findings, then the procedure report has been included in a sealed envelope for you to review at your convenience later.  YOU SHOULD EXPECT: Some feelings of bloating in the abdomen. Passage of more gas than usual.  Walking can help get rid of the air that was put into your GI tract during the procedure and reduce the bloating. If you had a lower endoscopy (such as a colonoscopy or flexible sigmoidoscopy) you may notice spotting of blood in your stool or on the toilet paper. If you underwent a bowel prep for your procedure, you may not have a normal bowel movement for a few days.  Please Note:  You might notice some irritation and congestion in your nose or some drainage.  This is from the oxygen used during your procedure.  There is no need for concern and it should clear up in a day or so.  SYMPTOMS TO REPORT IMMEDIATELY:   Following lower endoscopy (colonoscopy or flexible sigmoidoscopy):  Excessive amounts of blood in the stool  Significant tenderness or worsening of abdominal pains  Swelling of the abdomen that is new, acute  Fever of 100F or higher   For urgent or emergent issues, a gastroenterologist can be reached at any hour by calling [phone].   DIET:  We do recommend a small meal at first, but then you may proceed to your regular diet.  Drink plenty of fluids but you should avoid alcoholic beverages for 24 hours.  MEDICATIONS: Continue present medications.  Please see handouts given to you by your recovery nurse.  Follow up with Dr. Lyndel Safe as needed.  ACTIVITY:  You should plan to take it easy  for the rest of today and you should NOT DRIVE or use heavy machinery until tomorrow (because of the sedation medicines used during the test).    FOLLOW UP: Our staff will call the number listed on your records 48-72 hours following your procedure to check on you and address any questions or concerns that you may have regarding the information given to you following your procedure. If we do not reach you, we will leave a message.  We will attempt to reach you two times.  During this call, we will ask if you have developed any symptoms of COVID 19. If you develop any symptoms (ie: fever, flu-like symptoms, shortness of breath, cough etc.) before then, please call [phone].  If you test positive for Covid 19 in the 2 weeks post procedure, please call and report this information to [place].    If any biopsies were taken you will be contacted by phone or by letter within the next 1-3 weeks.  Please call us at [phone] if you have not heard about the biopsies in 3 weeks.   Thank you for allowing [place] to provide for your healthcare needs today.   SIGNATURES/CONFIDENTIALITY: You and/or your care partner have signed paperwork which will be entered into your electronic medical record.  These signatures attest to the fact that that the information above on your After Visit Summary has been reviewed and is understood.  Full responsibility of the confidentiality  of this discharge information lies with you and/or your care-partner.

## 2019-07-15 NOTE — Progress Notes (Signed)
Pt's states no medical or surgical changes since previsit or office visit.  [place]

## 2019-07-15 NOTE — Op Note (Signed)
[facility] Patient Name: Anne Lawrence Procedure Date: 07/15/2019 10:41 AM MRN: [id number] Endoscopist: Jackquline Denmark , MD Age: 53 Referring MD:  Date of Birth: [birth year] Gender: Female Account #: [id number] Procedure:                Colonoscopy Indications:              FH colon cancer (brother at age 87 but stage IV,                            maternal grandmother) Medicines:                Monitored Anesthesia Care Procedure:                Pre-Anesthesia Assessment:                           - Prior to the procedure, a History and Physical                            was performed, and patient medications and                            allergies were reviewed. The patient's tolerance of                            previous anesthesia was also reviewed. The risks                            and benefits of the procedure and the sedation                            options and risks were discussed with the patient.                            All questions were answered, and informed consent                            was obtained. Prior Anticoagulants: The patient has                            taken no previous anticoagulant or antiplatelet                            agents. ASA Grade Assessment: II - A patient with                            mild systemic disease. After reviewing the risks                            and benefits, the patient was deemed in                            satisfactory condition to undergo the procedure.  After obtaining informed consent, the colonoscope                            was passed under direct vision. Throughout the                            procedure, the patient's blood pressure, pulse, and                            oxygen saturations were monitored continuously. The                            Colonoscope was introduced through the anus and                            advanced to the 2 cm into the ileum. The                             colonoscopy was performed without difficulty. The                            patient tolerated the procedure well. The quality                            of the bowel preparation was good. The terminal                            ileum, ileocecal valve, appendiceal orifice, and                            rectum were photographed. Scope In: 10:48:37 AM Scope Out: 10:59:33 AM Scope Withdrawal Time: 0 hours 6 minutes 24 seconds  Total Procedure Duration: 0 hours 10 minutes 56 seconds  Findings:                 Multiple small-mouthed diverticula were found in                            the sigmoid colon, few in descending colon and                            ascending colon.                           Non-bleeding internal hemorrhoids were found during                            retroflexion. The hemorrhoids were small.                           The terminal ileum appeared normal.                           The exam was otherwise without abnormality. Complications:            No immediate complications.  Estimated Blood Loss:     Estimated blood loss: none. Impression:               -Pancolonic diverticulosis predominantly in the                            sigmoid colon.                           -Otherwise normal colonoscopy to TI. Recommendation:           - Patient has a contact number available for                            emergencies. The signs and symptoms of potential                            delayed complications were discussed with the                            patient. Return to normal activities tomorrow.                            Written discharge instructions were provided to the                            patient.                           - Resume previous diet.                           - Continue present medications.                           - Repeat colonoscopy in 5 years for screening                            purposes. Earlier, if with any  new problems or if                            there is any change in family history.                           - Return to GI clinic PRN.                           - D/W Roderic Palau (patient's husband) over the phone. Jackquline Denmark, MD 07/15/2019 11:06:48 AM This report has been signed electronically.

## 2019-07-19 ENCOUNTER — Telehealth: Payer: Self-pay

## 2019-07-19 NOTE — Telephone Encounter (Signed)
  Follow up Call-  Call back number 07/15/2019  Post procedure Call Back phone  # [phone]  Permission to leave phone message Yes  Some recent data might be hidden     Patient questions:  Do you have a fever, pain , or abdominal swelling? No. Pain Score  0 *  Have you tolerated food without any problems? Yes.    Have you been able to return to your normal activities? Yes.    Do you have any questions about your discharge instructions: Diet   No. Medications  No. Follow up visit  No.  Do you have questions or concerns about your Care? No.  Actions: * If pain score is 4 or above: No action needed, pain <4. 1. Have you developed a fever since your procedure? no  2.   Have you had an respiratory symptoms (SOB or cough) since your procedure? no  3.   Have you tested positive for COVID 19 since your procedure no  4.   Have you had any family members/close contacts diagnosed with the COVID 19 since your procedure?  no   If yes to any of these questions please route to Joylene John, RN and Alphonsa Gin, Therapist, sports.

## 2019-08-02 ENCOUNTER — Telehealth: Payer: PRIVATE HEALTH INSURANCE | Admitting: Cardiology

## 2019-09-05 ENCOUNTER — Ambulatory Visit: Payer: PRIVATE HEALTH INSURANCE | Admitting: Cardiology

## 2019-09-19 ENCOUNTER — Ambulatory Visit (INDEPENDENT_AMBULATORY_CARE_PROVIDER_SITE_OTHER): Payer: PRIVATE HEALTH INSURANCE | Admitting: Cardiology

## 2019-09-19 ENCOUNTER — Other Ambulatory Visit: Payer: Self-pay

## 2019-09-19 VITALS — BP 132/64 | HR 65 | Ht 62.0 in | Wt 267.0 lb

## 2019-09-19 DIAGNOSIS — R002 Palpitations: Secondary | ICD-10-CM

## 2019-09-19 DIAGNOSIS — R0789 Other chest pain: Secondary | ICD-10-CM | POA: Diagnosis not present

## 2019-09-19 DIAGNOSIS — R9439 Abnormal result of other cardiovascular function study: Secondary | ICD-10-CM

## 2019-09-19 HISTORY — DX: Abnormal result of other cardiovascular function study: R94.39

## 2019-09-19 LAB — LIPID PANEL
Chol/HDL Ratio: 3.2 ratio (ref 0.0–4.4)
Cholesterol, Total: 140 mg/dL (ref 100–199)
HDL: 44 mg/dL (ref >39)
LDL Chol Calc (NIH): 78 mg/dL (ref 0–99)
Triglycerides: 97 mg/dL (ref 0–149)
VLDL Cholesterol Cal: 18 mg/dL (ref 5–40)

## 2019-09-19 MED ORDER — ASPIRIN EC 81 MG PO TBEC: 81.0000 mg | DELAYED_RELEASE_TABLET | Freq: Every day | ORAL | 3 refills | Status: DC

## 2019-09-19 NOTE — Progress Notes (Signed)
Cardiology Office Note:    Date:  09/19/2019   ID:  Anne Lawrence, DOB 19-Sep-1966, MRN [id number]  PCP:  Myrlene Broker, MD  Cardiologist:  Jenne Campus, MD    Referring MD: Myrlene Broker, MD   Chief Complaint  Patient presents with  . Follow-up  Doing well  History of Present Illness:    Anne Lawrence is a 53 y.o. female with multiple risk factors for coronary artery disease.  She did have a stress test done in October of last year which showed minimal area of questionable ischemia.  She did have a fixed defect which appears to be attenuation with minimal ischemia rendered.  Echocardiogram after that showed preserved/normal left ventricular ejection fraction.  She is asymptomatic she is busy taking care of her mother who is 71 years old also her children.  Denies having any recent chest pain, tightness, pressure, burning in the chest.  Overall seems to be doing well  Past Medical History:  Diagnosis Date  . Allergy   . Arthritis   . Atypical chest pain   . 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 Bronchitis   . Chronic kidney disease    kidney stones   . Claustrophobia    in machines like MRI machine - causes anxiety   . Family history of breast cancer   . Family history of colon cancer   . GERD (gastroesophageal reflux disease)   . Headache   . History of kidney stones   . Macromastia   . 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 Personal history of radiation therapy   . PONV (postoperative nausea and vomiting)    n/v with [phone]  . Rapid heart beat    takes metoprolol    Past Surgical History:  Procedure Laterality Date  . BACK SURGERY  1998  . BREAST LUMPECTOMY Left 2018  . BREAST LUMPECTOMY WITH RADIOACTIVE SEED AND SENTINEL LYMPH NODE BIOPSY Left 03/12/2017   Procedure: LEFT BREAST LUMPECTOMY WITH BRACKETED  RADIOACTIVE SEED AND SENTINEL LYMPH NODE BIOPSY;  Surgeon: Rolm Bookbinder, MD;  Location: [facility];  Service: General;   Laterality: Left;  . BREAST REDUCTION SURGERY Bilateral 03/20/2017   Procedure: BILATERAL ONCOPLASTIC BREAST REDUCTION;  Surgeon: Irene Limbo, MD;  Location: [facility];  Service: Plastics;  Laterality: Bilateral;  . CHOLECYSTECTOMY  1998  . COLONOSCOPY  10/23/2016   Mild pancolonic diverticulosis.   Marland Kitchen LIPOMA EXCISION Right 04/2016   shoulder blade area  . POLYPECTOMY    . UPPER GASTROINTESTINAL ENDOSCOPY    . WISDOM TOOTH EXTRACTION      Current Medications: Current Meds  Medication Sig  . acetaminophen (TYLENOL) 500 MG tablet Take 500 mg by mouth every 6 (six) hours as needed.  Marland Kitchen anastrozole (ARIMIDEX) 1 MG tablet Take 1 mg by mouth daily.  . Carboxymethylcellul-Glycerin (LUBRICATING EYE DROPS OP) Apply 1 drop to eye daily as needed (dry eyes).  . Chlorpheniramine-PSE-Ibuprofen (ADVIL ALLERGY SINUS PO) Take 1 tablet by mouth daily as needed (allergies).  . cholestyramine (QUESTRAN) 4 g packet Take 1 packet (4 g total) by mouth daily.  . Cyanocobalamin (B-12 COMPLIANCE INJECTION IJ) Inject as directed every 30 (thirty) days.  Marland Kitchen esomeprazole (NEXIUM) 40 MG capsule Take 40 mg by mouth daily as needed (acid reflux).  . fluticasone (FLONASE) 50 MCG/ACT nasal spray instill 1 spray into each nostril twice daily  . ibuprofen (ADVIL,MOTRIN) 200 MG tablet Take 200 mg by mouth every 6 (six) hours  as needed (takes 3 tablets).  Marland Kitchen levocetirizine (XYZAL) 5 MG tablet Take 5 mg by mouth daily as needed for allergies.  . meloxicam (MOBIC) 15 MG tablet Take by mouth as needed.   . metoprolol succinate (TOPROL-XL) 25 MG 24 hr tablet Take 25 mg by mouth at bedtime.  . vitamin A 10000 UT capsule Take by mouth.   Current Facility-Administered Medications for the 09/19/19 encounter (Office Visit) with Park Liter, MD  Medication  . 0.9 %  sodium chloride infusion     Allergies:   Sulfamethoxazole-trimethoprim   Social History   Socioeconomic History  . Marital status: Married    Spouse  name: Not on file  . Number of children: 1  . Years of education: Not on file  . Highest education level: Not on file  Occupational History  . Not on file  Social Needs  . Financial resource strain: Not on file  . Food insecurity    Worry: Not on file    Inability: Not on file  . Transportation needs    Medical: Not on file    Non-medical: Not on file  Tobacco Use  . Smoking status: Never Smoker  . Smokeless tobacco: Never Used  Substance and Sexual Activity  . Alcohol use: No  . Drug use: No  . Sexual activity: Not on file  Lifestyle  . Physical activity    Days per week: Not on file    Minutes per session: Not on file  . Stress: Not on file  Relationships  . Social Herbalist on phone: Not on file    Gets together: Not on file    Attends religious service: Not on file    Active member of club or organization: Not on file    Attends meetings of clubs or organizations: Not on file    Relationship status: Not on file  Other Topics Concern  . Not on file  Social History Narrative  . Not on file     Family History: The patient's family history includes Breast cancer in her cousin and cousin; Breast cancer (age of onset: 81) in her mother; COPD in her paternal grandfather; Colon cancer in her maternal grandmother; Colon cancer (age of onset: 56) in her brother; Congestive Heart Failure in her maternal aunt and maternal uncle; Heart attack in her father and maternal grandfather; Leukemia in her maternal aunt; Lung cancer in her maternal aunt; Skin cancer in her paternal aunt and paternal uncle; Stroke in her paternal grandmother. There is no history of Esophageal cancer, Colon polyps, Rectal cancer, or Stomach cancer. ROS:   Please see the history of present illness.    All 14 point review of systems negative except as described per history of present illness  EKGs/Labs/Other Studies Reviewed:    EKG done today showed normal sinus rhythm, low voltage, poor R wave  progression anterior precordium, nonspecific ST segment changes  Recent Labs: 11/03/2018: ALT 33  Recent Lipid Panel    Component Value Date/Time   CHOL 143 11/03/2018 1022   TRIG 83 11/03/2018 1022   HDL 42 11/03/2018 1022   CHOLHDL 3.4 11/03/2018 1022   LDLCALC 84 11/03/2018 1022    Physical Exam:    VS:  BP 132/64   Pulse 65   Ht 5\' 2"  (1.575 m)   Wt 267 lb (121.1 kg)   LMP  (LMP Unknown) Comment: last injection in January - no more per Dr. Donne Hazel  SpO2 98%  BMI 48.83 kg/m     Wt Readings from Last 3 Encounters:  09/19/19 267 lb (121.1 kg)  07/15/19 262 lb (118.8 kg)  07/01/19 260 lb (117.9 kg)     GEN:  Well nourished, well developed in no acute distress HEENT: Normal NECK: No JVD; No carotid bruits LYMPHATICS: No lymphadenopathy CARDIAC: RRR, no murmurs, no rubs, no gallops RESPIRATORY:  Clear to auscultation without rales, wheezing or rhonchi  ABDOMEN: Soft, non-tender, non-distended MUSCULOSKELETAL:  No edema; No deformity  SKIN: Warm and dry LOWER EXTREMITIES: no swelling NEUROLOGIC:  Alert and oriented x 3 PSYCHIATRIC:  Normal affect   ASSESSMENT:    1. Palpitations   2. Atypical chest pain    PLAN:    In order of problems listed above:  1. Palpitations denies having any seems to be well suppressed with beta-blocker. 2. Atypical chest pain denies having any. 3. Mildly abnormal stress test in the fall of last year.  We will continue conservative approach.  I told her if she still having some tightness with exertion to let me know I asked her to start taking 1 baby aspirin every single day check her fasting lipid profile today.   Medication Adjustments/Labs and Tests Ordered: Current medicines are reviewed at length with the patient today.  Concerns regarding medicines are outlined above.  No orders of the defined types were placed in this encounter.  Medication changes: No orders of the defined types were placed in this encounter.   Signed,  Park Liter, MD, [address] [facility] 09/19/2019 9:02 AM    [place]

## 2019-09-19 NOTE — Patient Instructions (Signed)
Medication Instructions:  Your physician has recommended you make the following change in your medication:    START: aspirin 81 mg daily   *If you need a refill on your cardiac medications before your next appointment, please call your pharmacy*  Lab Work: Your physician recommends that you return for lab work today: Lipids   If you have labs (blood work) drawn today and your tests are completely normal, you will receive your results only by: Marland Kitchen MyChart Message (if you have MyChart) OR . A paper copy in the mail If you have any lab test that is abnormal or we need to change your treatment, we will call you to review the results.  Testing/Procedures: None.   Follow-Up: At [facility], you and your health needs are our priority.  As part of our continuing mission to provide you with exceptional heart care, we have created designated Provider Care Teams.  These Care Teams include your primary Cardiologist (physician) and Advanced Practice Providers (APPs -  Physician Assistants and Nurse Practitioners) who all work together to provide you with the care you need, when you need it.  Your next appointment:   4 months  The format for your next appointment:   In Person  Provider:   Jenne Campus, MD  Other Instructions   Aspirin and Your Heart  Aspirin is a medicine that prevents the cells in the blood that are used for clotting, called platelets, from sticking together. Aspirin can be used to help reduce the risk of blood clots, heart attacks, and other heart-related problems. Can I take aspirin? Your health care provider will help you determine whether it is safe and beneficial for you to take aspirin daily. Taking aspirin daily may be helpful if you:  Have had a heart attack or chest pain.  Are at risk for a heart attack.  Have undergone open-heart surgery, such as coronary artery bypass surgery (CABG).  Have had coronary angioplasty or a stent.  Have had certain types  of stroke or transient ischemic attack (TIA).  Have peripheral artery disease (PAD).  Have chronic heart rhythm problems such as atrial fibrillation and cannot take an anticoagulant.  Have valve disease or have had surgery on a valve. What are the risks? Daily use of aspirin can cause side effects. Some of these include:  Bleeding. Bleeding problems can be minor or serious. An example of a minor problem is a cut that does not stop bleeding. An example of a more serious problem is stomach bleeding or, rarely, bleeding into the brain. Your risk of bleeding is increased if you are also taking non-steroidal anti-inflammatory drugs (NSAIDs).  Increased bruising.  Upset stomach.  An allergic reaction. People who have nasal polyps have an increased risk of developing an aspirin allergy. General guidelines  Take aspirin only as told by your health care provider. Make sure that you understand how much you should take and what form you should take. The two forms of aspirin are: ? Non-enteric-coated.This type of aspirin does not have a coating and is absorbed quickly. This type of aspirin also comes in a chewable form. ? Enteric-coated. This type of aspirin has a coating that releases the medicine very slowly. Enteric-coated aspirin might cause less stomach upset than non-enteric-coated aspirin. This type of aspirin should not be chewed or crushed.  Limit alcohol intake to no more than 1 drink a day for nonpregnant women and 2 drinks a day for men. Drinking alcohol increases your risk of bleeding. One  drink equals 12 oz of beer, 5 oz of wine, or 1 oz of hard liquor. Contact a health care provider if you:  Have unusual bleeding or bruising.  Have stomach pain or nausea.  Have ringing in your ears.  Have an allergic reaction that causes: ? Hives. ? Itchy skin. ? Swelling of the lips, tongue, or face. Get help right away if you:  Notice that your bowel movements are bloody, dark red, or  black in color.  Vomit or cough up blood.  Have blood in your urine.  Cough, have noisy breathing (wheeze), or feel short of breath.  Have chest pain, especially if the pain spreads to the arms, back, neck, or jaw.  Have a severe headache, or a headache with confusion, or dizziness. These symptoms may represent a serious problem that is an emergency. Do not wait to see if the symptoms will go away. Get medical help right away. Call your local emergency services (911 in the [place] Do not drive yourself to the hospital. Summary  Aspirin can be used to help reduce the risk of blood clots, heart attacks, and other heart-related problems.  Daily use of aspirin can increase your risk of side effects. Your health care provider will help you determine whether it is safe and beneficial for you to take aspirin daily.  Take aspirin only as told by your health care provider. Make sure that you understand how much you can take and what form you can take. This information is not intended to replace advice given to you by your health care provider. Make sure you discuss any questions you have with your health care provider. Document Released: 10/23/2008 Document Revised: 09/10/2017 Document Reviewed: 09/10/2017 Elsevier Patient Education  2020 [employer].

## 2019-09-19 NOTE — Addendum Note (Signed)
Addended by: Ashok Norris on: 09/19/2019 09:11 AM   Modules accepted: Orders

## 2019-09-19 NOTE — Addendum Note (Signed)
Addended by: Ashok Norris on: 09/19/2019 09:09 AM   Modules accepted: Orders

## 2019-09-23 DIAGNOSIS — E538 Deficiency of other specified B group vitamins: Secondary | ICD-10-CM

## 2019-09-23 DIAGNOSIS — Z853 Personal history of malignant neoplasm of breast: Secondary | ICD-10-CM

## 2019-10-14 ENCOUNTER — Other Ambulatory Visit: Payer: Self-pay | Admitting: Oncology

## 2019-10-14 DIAGNOSIS — C50812 Malignant neoplasm of overlapping sites of left female breast: Secondary | ICD-10-CM

## 2019-12-29 ENCOUNTER — Ambulatory Visit (INDEPENDENT_AMBULATORY_CARE_PROVIDER_SITE_OTHER): Payer: PRIVATE HEALTH INSURANCE | Admitting: Cardiology

## 2019-12-29 ENCOUNTER — Other Ambulatory Visit: Payer: Self-pay

## 2019-12-29 ENCOUNTER — Encounter: Payer: Self-pay | Admitting: Cardiology

## 2019-12-29 VITALS — BP 138/68 | HR 82 | Ht 62.0 in | Wt 267.0 lb

## 2019-12-29 DIAGNOSIS — R9439 Abnormal result of other cardiovascular function study: Secondary | ICD-10-CM

## 2019-12-29 DIAGNOSIS — F419 Anxiety disorder, unspecified: Secondary | ICD-10-CM

## 2019-12-29 DIAGNOSIS — R0789 Other chest pain: Secondary | ICD-10-CM | POA: Diagnosis not present

## 2019-12-29 DIAGNOSIS — E785 Hyperlipidemia, unspecified: Secondary | ICD-10-CM

## 2019-12-29 HISTORY — DX: Hyperlipidemia, unspecified: E78.5

## 2019-12-29 MED ORDER — ALPRAZOLAM 0.25 MG PO TABS: 0.2500 mg | ORAL_TABLET | Freq: Three times a day (TID) | ORAL | 0 refills | Status: DC | PRN

## 2019-12-29 NOTE — Patient Instructions (Signed)
Medication Instructions:  Your physician has recommended you make the following change in your medication:  Take: XANAX 0.25 mg as needed every 8 hours for anxiety   *If you need a refill on your cardiac medications before your next appointment, please call your pharmacy*  Lab Work: None.  If you have labs (blood work) drawn today and your tests are completely normal, you will receive your results only by: Marland Kitchen MyChart Message (if you have MyChart) OR . A paper copy in the mail If you have any lab test that is abnormal or we need to change your treatment, we will call you to review the results.  Testing/Procedures: None.   Follow-Up: At [facility], you and your health needs are our priority.  As part of our continuing mission to provide you with exceptional heart care, we have created designated Provider Care Teams.  These Care Teams include your primary Cardiologist (physician) and Advanced Practice Providers (APPs -  Physician Assistants and Nurse Practitioners) who all work together to provide you with the care you need, when you need it.  Your next appointment:   4 month(s)  The format for your next appointment:   In Person  Provider:   Jenne Campus, MD  Other Instructions  Alprazolam tablets What is this medicine? ALPRAZOLAM (al PRAY zoe lam) is a benzodiazepine. It is used to treat anxiety and panic attacks. This medicine may be used for other purposes; ask your health care provider or pharmacist if you have questions. COMMON BRAND NAME(S): Xanax What should I tell my health care provider before I take this medicine? They need to know if you have any of these conditions:  an alcohol or drug abuse problem  bipolar disorder, depression, psychosis or other mental health conditions  glaucoma  kidney or liver disease  lung or breathing disease  myasthenia gravis  Parkinson's disease  porphyria  seizures or a history of seizures  suicidal thoughts  an  unusual or allergic reaction to alprazolam, other benzodiazepines, foods, dyes, or preservatives  pregnant or trying to get pregnant  breast-feeding How should I use this medicine? Take this medicine by mouth with a glass of water. Follow the directions on the prescription label. Take your medicine at regular intervals. Do not take it more often than directed. Do not stop taking except on your doctor's advice. A special MedGuide will be given to you by the pharmacist with each prescription and refill. Be sure to read this information carefully each time. Talk to your pediatrician regarding the use of this medicine in children. Special care may be needed. Overdosage: If you think you have taken too much of this medicine contact a poison control center or emergency room at once. NOTE: This medicine is only for you. Do not share this medicine with others. What if I miss a dose? If you miss a dose, take it as soon as you can. If it is almost time for your next dose, take only that dose. Do not take double or extra doses. What may interact with this medicine? Do not take this medicine with any of the following medications:  certain antiviral medicines for HIV or AIDS like delavirdine, indinavir  certain medicines for fungal infections like ketoconazole and itraconazole  narcotic medicines for cough  sodium oxybate This medicine may also interact with the following medications:  alcohol  antihistamines for allergy, cough and cold  certain antibiotics like clarithromycin, erythromycin, isoniazid, rifampin, rifapentine, rifabutin, and troleandomycin  certain medicines for blood  pressure, heart disease, irregular heart beat  certain medicines for depression, like amitriptyline, fluoxetine, sertraline  certain medicines for seizures like carbamazepine, oxcarbazepine, phenobarbital, phenytoin, primidone  cimetidine  cyclosporine  female hormones, like estrogens or progestins and birth  control pills, patches, rings, or injections  general anesthetics like halothane, isoflurane, methoxyflurane, propofol  grapefruit juice  local anesthetics like lidocaine, pramoxine, tetracaine  medicines that relax muscles for surgery  narcotic medicines for pain  other antiviral medicines for HIV or AIDS  phenothiazines like chlorpromazine, mesoridazine, prochlorperazine, thioridazine This list may not describe all possible interactions. Give your health care provider a list of all the medicines, herbs, non-prescription drugs, or dietary supplements you use. Also tell them if you smoke, drink alcohol, or use illegal drugs. Some items may interact with your medicine. What should I watch for while using this medicine? Tell your doctor or health care professional if your symptoms do not start to get better or if they get worse. Do not stop taking except on your doctor's advice. You may develop a severe reaction. Your doctor will tell you how much medicine to take. You may get drowsy or dizzy. Do not drive, use machinery, or do anything that needs mental alertness until you know how this medicine affects you. To reduce the risk of dizzy and fainting spells, do not stand or sit up quickly, especially if you are an older patient. Alcohol may increase dizziness and drowsiness. Avoid alcoholic drinks. If you are taking another medicine that also causes drowsiness, you may have more side effects. Give your health care provider a list of all medicines you use. Your doctor will tell you how much medicine to take. Do not take more medicine than directed. Call emergency for help if you have problems breathing or unusual sleepiness. What side effects may I notice from receiving this medicine? Side effects that you should report to your doctor or health care professional as soon as possible:  allergic reactions like skin rash, itching or hives, swelling of the face, lips, or tongue  breathing  problems  confusion  loss of balance or coordination  signs and symptoms of low blood pressure like dizziness; feeling faint or lightheaded, falls; unusually weak or tired  suicidal thoughts or other mood changes Side effects that usually do not require medical attention (report to your doctor or health care professional if they continue or are bothersome):  dizziness  dry mouth  nausea, vomiting  tiredness This list may not describe all possible side effects. Call your doctor for medical advice about side effects. You may report side effects to FDA at 1-800-FDA-1088. Where should I keep my medicine? Keep out of the reach of children. This medicine can be abused. Keep your medicine in a safe place to protect it from theft. Do not share this medicine with anyone. Selling or giving away this medicine is dangerous and against the law. Store at room temperature between 20 and 25 degrees C (68 and 77 degrees F). This medicine may cause accidental overdose and death if taken by other adults, children, or pets. Mix any unused medicine with a substance like cat litter or coffee grounds. Then throw the medicine away in a sealed container like a sealed bag or a coffee can with a lid. Do not use the medicine after the expiration date. NOTE: This sheet is a summary. It may not cover all possible information. If you have questions about this medicine, talk to your doctor, pharmacist, or health care provider.  2020 Elsevier/Gold Standard (2015-08-09 13:47:25)
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
## 2019-12-29 NOTE — Addendum Note (Signed)
Addended by: Ashok Norris on: 12/29/2019 01:57 PM   Modules accepted: Orders

## 2020-02-13 ENCOUNTER — Other Ambulatory Visit: Payer: Self-pay

## 2020-02-13 ENCOUNTER — Ambulatory Visit
Admission: RE | Admit: 2020-02-13 | Discharge: 2020-02-13 | Disposition: A | Discharge status: Home / Self Care | Payer: PRIVATE HEALTH INSURANCE | Source: Ambulatory Visit

## 2020-02-13 DIAGNOSIS — C50812 Malignant neoplasm of overlapping sites of left female breast: Secondary | ICD-10-CM

## 2020-02-16 DIAGNOSIS — C50812 Malignant neoplasm of overlapping sites of left female breast: Secondary | ICD-10-CM

## 2020-02-16 DIAGNOSIS — D509 Iron deficiency anemia, unspecified: Secondary | ICD-10-CM

## 2020-03-24 ENCOUNTER — Other Ambulatory Visit: Payer: Self-pay | Admitting: Gastroenterology

## 2020-03-26 DIAGNOSIS — R399 Unspecified symptoms and signs involving the genitourinary system: Secondary | ICD-10-CM

## 2020-03-26 HISTORY — DX: Unspecified symptoms and signs involving the genitourinary system: R39.9

## 2020-05-31 DIAGNOSIS — S90129A Contusion of unspecified lesser toe(s) without damage to nail, initial encounter: Secondary | ICD-10-CM

## 2020-05-31 DIAGNOSIS — S20219A Contusion of unspecified front wall of thorax, initial encounter: Secondary | ICD-10-CM

## 2020-05-31 DIAGNOSIS — S39012A Strain of muscle, fascia and tendon of lower back, initial encounter: Secondary | ICD-10-CM

## 2020-05-31 HISTORY — DX: Strain of muscle, fascia and tendon of lower back, initial encounter: S39.012A

## 2020-05-31 HISTORY — DX: Contusion of unspecified lesser toe(s) without damage to nail, initial encounter: S90.129A

## 2020-05-31 HISTORY — DX: Contusion of unspecified front wall of thorax, initial encounter: S20.219A

## 2020-06-10 ENCOUNTER — Other Ambulatory Visit: Payer: Self-pay | Admitting: Gastroenterology

## 2020-06-11 ENCOUNTER — Telehealth: Payer: Self-pay | Admitting: Gastroenterology

## 2020-06-11 ENCOUNTER — Other Ambulatory Visit: Payer: Self-pay

## 2020-06-11 MED ORDER — CHOLESTYRAMINE 4 G PO PACK: PACK | ORAL | 0 refills | Status: DC

## 2020-06-11 NOTE — Telephone Encounter (Signed)
Pt is requesting a medication refill on her cholestyramine

## 2020-07-18 ENCOUNTER — Telehealth: Payer: Self-pay | Admitting: Cardiology

## 2020-07-18 NOTE — Telephone Encounter (Signed)
Yes she can take it.

## 2020-07-18 NOTE — Telephone Encounter (Signed)
Spoke to the patient just now and let her know Dr. Wendy Poet recommendations. She verbalizes understanding and thanks me for the call back.

## 2020-07-18 NOTE — Telephone Encounter (Signed)
Patient is calling to see what Dr. Agustin Cree thinks about her taking the first dose of the [place] shot. She wants to know if it safe for her to get the covid vaccine with being on metoprolol and her heart condition. Please advise.

## 2020-08-07 ENCOUNTER — Telehealth: Payer: Self-pay | Admitting: Gastroenterology

## 2020-08-07 NOTE — Telephone Encounter (Signed)
Pt is requesting a refill on her Questran medication, pt is scheduled for an appt 10/10/2020

## 2020-08-08 MED ORDER — CHOLESTYRAMINE 4 G PO PACK: PACK | ORAL | 1 refills | Status: DC

## 2020-08-08 NOTE — Telephone Encounter (Signed)
Sent refill to patients pharmacy. 

## 2020-08-12 ENCOUNTER — Encounter: Payer: Self-pay | Admitting: Oncology

## 2020-08-20 ENCOUNTER — Encounter: Payer: Self-pay | Admitting: Pharmacist

## 2020-08-20 DIAGNOSIS — D519 Vitamin B12 deficiency anemia, unspecified: Secondary | ICD-10-CM | POA: Insufficient documentation

## 2020-08-20 HISTORY — DX: Vitamin B12 deficiency anemia, unspecified: D51.9

## 2020-08-23 ENCOUNTER — Ambulatory Visit: Payer: PRIVATE HEALTH INSURANCE | Admitting: Cardiology

## 2020-08-30 ENCOUNTER — Other Ambulatory Visit: Payer: Self-pay | Admitting: Hematology and Oncology

## 2020-08-30 DIAGNOSIS — C50812 Malignant neoplasm of overlapping sites of left female breast: Secondary | ICD-10-CM

## 2020-08-30 LAB — BASIC METABOLIC PANEL
BUN: 15 (ref 4–21)
CO2: 26 — AB (ref 13–22)
Chloride: 105 (ref 99–108)
Creatinine: 0.6 (ref 0.5–1.1)
Glucose: 117
Potassium: 3.8 (ref 3.4–5.3)
Sodium: 142 (ref 137–147)

## 2020-08-30 LAB — HEPATIC FUNCTION PANEL
ALT: 56 — AB (ref 7–35)
AST: 53 — AB (ref 13–35)
Alkaline Phosphatase: 94 (ref 25–125)
Bilirubin, Total: 0.4

## 2020-08-30 LAB — CBC AND DIFFERENTIAL
HCT: 39 (ref 36–46)
Hemoglobin: 12.7 (ref 12.0–16.0)
Neutrophils Absolute: 4760
Platelets: 227 (ref 150–399)
WBC: 7.8

## 2020-08-30 LAB — COMPREHENSIVE METABOLIC PANEL
Albumin: 4.2 (ref 3.5–5.0)
Calcium: 9.5 (ref 8.7–10.7)

## 2020-08-30 LAB — CBC: RBC: 4.95 (ref 3.87–5.11)

## 2020-08-31 ENCOUNTER — Inpatient Hospital Stay: Payer: PRIVATE HEALTH INSURANCE | Attending: Oncology

## 2020-08-31 ENCOUNTER — Other Ambulatory Visit: Payer: Self-pay

## 2020-08-31 VITALS — BP 131/61 | HR 78 | Temp 98.2°F | Resp 18

## 2020-08-31 DIAGNOSIS — E538 Deficiency of other specified B group vitamins: Secondary | ICD-10-CM | POA: Insufficient documentation

## 2020-08-31 DIAGNOSIS — D519 Vitamin B12 deficiency anemia, unspecified: Secondary | ICD-10-CM

## 2020-08-31 MED ORDER — CYANOCOBALAMIN 1000 MCG/ML IJ SOLN
1000.0000 ug | Freq: Once | INTRAMUSCULAR | Status: AC
  Administered 2020-08-31: 1000 ug via INTRAMUSCULAR

## 2020-08-31 MED ORDER — CYANOCOBALAMIN 1000 MCG/ML IJ SOLN
INTRAMUSCULAR | Status: AC
  Filled 2020-08-31: qty 1

## 2020-08-31 NOTE — Progress Notes (Signed)
Pt stable at time of discharge. 

## 2020-08-31 NOTE — Patient Instructions (Signed)

## 2020-09-25 NOTE — Progress Notes (Signed)
PT STABLE AT TIME OF DISCHARGE 

## 2020-10-03 NOTE — Addendum Note (Signed)
Addended by: Juanetta Beets on: 10/03/2020 11:51 AM   Modules accepted: Orders

## 2020-10-04 ENCOUNTER — Inpatient Hospital Stay: Payer: PRIVATE HEALTH INSURANCE | Attending: Oncology

## 2020-10-04 ENCOUNTER — Other Ambulatory Visit: Payer: Self-pay

## 2020-10-04 VITALS — BP 160/75 | HR 63 | Temp 98.7°F | Resp 18 | Ht 61.0 in | Wt 267.2 lb

## 2020-10-04 DIAGNOSIS — E538 Deficiency of other specified B group vitamins: Secondary | ICD-10-CM | POA: Insufficient documentation

## 2020-10-04 DIAGNOSIS — D519 Vitamin B12 deficiency anemia, unspecified: Secondary | ICD-10-CM

## 2020-10-04 MED ORDER — CYANOCOBALAMIN 1000 MCG/ML IJ SOLN
INTRAMUSCULAR | Status: AC
  Filled 2020-10-04: qty 1

## 2020-10-04 MED ORDER — CYANOCOBALAMIN 1000 MCG/ML IJ SOLN
1000.0000 ug | Freq: Once | INTRAMUSCULAR | Status: AC
  Administered 2020-10-04: 1000 ug via INTRAMUSCULAR

## 2020-10-04 NOTE — Patient Instructions (Signed)

## 2020-10-04 NOTE — Progress Notes (Signed)
Pt stable at time of discharge. 

## 2020-10-10 ENCOUNTER — Encounter: Payer: Self-pay | Admitting: Gastroenterology

## 2020-10-10 ENCOUNTER — Ambulatory Visit: Payer: PRIVATE HEALTH INSURANCE | Admitting: Gastroenterology

## 2020-10-10 VITALS — BP 128/68 | HR 88 | Ht 61.0 in | Wt 267.4 lb

## 2020-10-10 DIAGNOSIS — R945 Abnormal results of liver function studies: Secondary | ICD-10-CM | POA: Diagnosis not present

## 2020-10-10 DIAGNOSIS — K760 Fatty (change of) liver, not elsewhere classified: Secondary | ICD-10-CM

## 2020-10-10 DIAGNOSIS — K219 Gastro-esophageal reflux disease without esophagitis: Secondary | ICD-10-CM

## 2020-10-10 DIAGNOSIS — R7989 Other specified abnormal findings of blood chemistry: Secondary | ICD-10-CM

## 2020-10-10 DIAGNOSIS — K580 Irritable bowel syndrome with diarrhea: Secondary | ICD-10-CM

## 2020-10-10 MED ORDER — ESOMEPRAZOLE MAGNESIUM 40 MG PO CPDR: 40.0000 mg | DELAYED_RELEASE_CAPSULE | ORAL | 11 refills | Status: DC

## 2020-10-10 MED ORDER — CHOLESTYRAMINE 4 G PO PACK: PACK | ORAL | 11 refills | Status: DC

## 2020-10-10 MED ORDER — CHOLESTYRAMINE 4 G PO PACK
PACK | ORAL | 11 refills | Status: DC
Start: 2020-10-10 — End: 2021-05-02

## 2020-10-10 MED ORDER — ESOMEPRAZOLE MAGNESIUM 40 MG PO CPDR: 40.0000 mg | DELAYED_RELEASE_CAPSULE | Freq: Every day | ORAL | 11 refills | Status: DC
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
## 2020-10-10 NOTE — Patient Instructions (Signed)
If you are age 54 or older, your body mass index should be between 23-30. Your Body mass index is 50.52 kg/m. If this is out of the aforementioned range listed, please consider follow up with your Primary Care Provider.  If you are age 64 or younger, your body mass index should be between 19-25. Your Body mass index is 50.52 kg/m. If this is out of the aformentioned range listed, please consider follow up with your Primary Care Provider.   You have been scheduled for an abdominal ultrasound with elastography at [facility] (1st floor). Your appointment is scheduled for 11/232/21 at 8am. Please arrive 15 minutes prior to your scheduled appointment for registration purposes. Make certain not to have anything to eat or drink 6 hours prior to your procedure. Should you need to reschedule your appointment, you may contact radiology at [phone].  Liver Elastography Various chronic liver diseases such as hepatitis B, C, and fatty liver disease can lead to tissue damage and subsequent scar tissue formation. As the scar tissue accumulates, the liver loses some of its elasticity and becomes stiffer. Liver elastography involves the use of a surface ultrasound probe that delivers a low frequency pulse or shear wave to a small volume of liver tissue under the rib cage. The transmission of the sound wave is completely painless. How Is a Liver Elastography Performed? The liver is located in the right upper abdomen under the rib cage. Patients are asked to lie flat on an examination table. A technician places the FibroScan probe between the ribs on the right side of the lower chest wall. A series of 10 painless pulses are then applied to the liver. The results are recorded on the equipment and an overall liver stiffness score is generated. This score is then interpreted by a qualified physician to predict the likelihood of advanced fibrosis or cirrhosis.  Patients are asked to wear loose clothing and  should not consume any liquids or solids for a minimum of 4 hours before the test to increase the likelihood of obtaining reliable test results. The scan will take 10 to 15 minutes to complete, but patients should plan on being available for 30 minutes to allow time for preparation   We have sent the following medications to your pharmacy for you to pick up at your convenience: Nexium Cholestyramine   Thank you,  Dr. Jackquline Denmark

## 2020-10-15 NOTE — Progress Notes (Signed)
PT STABLE AT TIME OF DISCHARGE 

## 2020-10-16 ENCOUNTER — Ambulatory Visit (HOSPITAL_COMMUNITY): Payer: PRIVATE HEALTH INSURANCE

## 2020-10-23 ENCOUNTER — Other Ambulatory Visit: Payer: Self-pay | Admitting: Pharmacist

## 2020-10-24 ENCOUNTER — Telehealth: Payer: Self-pay | Admitting: Oncology

## 2020-10-24 ENCOUNTER — Telehealth: Payer: Self-pay | Admitting: Gastroenterology

## 2020-10-24 NOTE — Telephone Encounter (Signed)
Per 11/30 Staff Message, patient's Dec thru March 2022 B12 Injections have been Adjusted.  Notified patient of the 12/2 to 12/9. Requested updated Patient Appt to be given at the 12/9 Appt

## 2020-10-25 ENCOUNTER — Inpatient Hospital Stay: Payer: PRIVATE HEALTH INSURANCE

## 2020-10-25 NOTE — Telephone Encounter (Signed)
I thought it was working very well ?  Reason for change  - ?  Insurance or otherwise There is cholestyramine lite as well which she can try RG

## 2020-10-25 NOTE — Telephone Encounter (Signed)
Would you like to change this to something else?

## 2020-10-30 ENCOUNTER — Other Ambulatory Visit: Payer: Self-pay

## 2020-10-30 DIAGNOSIS — J40 Bronchitis, not specified as acute or chronic: Secondary | ICD-10-CM | POA: Insufficient documentation

## 2020-10-30 DIAGNOSIS — N189 Chronic kidney disease, unspecified: Secondary | ICD-10-CM | POA: Insufficient documentation

## 2020-10-30 DIAGNOSIS — Z87442 Personal history of urinary calculi: Secondary | ICD-10-CM | POA: Insufficient documentation

## 2020-10-30 DIAGNOSIS — R000 Tachycardia, unspecified: Secondary | ICD-10-CM | POA: Insufficient documentation

## 2020-10-30 DIAGNOSIS — K219 Gastro-esophageal reflux disease without esophagitis: Secondary | ICD-10-CM | POA: Insufficient documentation

## 2020-10-30 DIAGNOSIS — R112 Nausea with vomiting, unspecified: Secondary | ICD-10-CM | POA: Insufficient documentation

## 2020-10-30 DIAGNOSIS — N62 Hypertrophy of breast: Secondary | ICD-10-CM | POA: Insufficient documentation

## 2020-10-30 DIAGNOSIS — Z9889 Other specified postprocedural states: Secondary | ICD-10-CM | POA: Insufficient documentation

## 2020-10-30 DIAGNOSIS — F40240 Claustrophobia: Secondary | ICD-10-CM | POA: Insufficient documentation

## 2020-10-30 DIAGNOSIS — Z923 Personal history of irradiation: Secondary | ICD-10-CM | POA: Insufficient documentation

## 2020-10-30 DIAGNOSIS — R519 Headache, unspecified: Secondary | ICD-10-CM | POA: Insufficient documentation

## 2020-10-30 DIAGNOSIS — D171 Benign lipomatous neoplasm of skin and subcutaneous tissue of trunk: Secondary | ICD-10-CM | POA: Insufficient documentation

## 2020-10-30 DIAGNOSIS — M1990 Unspecified osteoarthritis, unspecified site: Secondary | ICD-10-CM | POA: Insufficient documentation

## 2020-10-30 DIAGNOSIS — C50812 Malignant neoplasm of overlapping sites of left female breast: Secondary | ICD-10-CM | POA: Insufficient documentation

## 2020-10-30 DIAGNOSIS — T7840XA Allergy, unspecified, initial encounter: Secondary | ICD-10-CM | POA: Insufficient documentation

## 2020-10-30 DIAGNOSIS — G709 Myoneural disorder, unspecified: Secondary | ICD-10-CM | POA: Insufficient documentation

## 2020-10-30 HISTORY — DX: Benign lipomatous neoplasm of skin and subcutaneous tissue of trunk: D17.1

## 2020-10-30 MED ORDER — CHOLESTYRAMINE LIGHT 4 GM/DOSE PO POWD
4.0000 g | Freq: Every day | ORAL | 11 refills | Status: DC
Start: 2020-10-30 — End: 2020-10-31

## 2020-10-30 NOTE — Telephone Encounter (Signed)
Sent prescription to patients pharmacy.  

## 2020-10-31 ENCOUNTER — Other Ambulatory Visit: Payer: Self-pay

## 2020-10-31 ENCOUNTER — Encounter: Payer: Self-pay | Admitting: Cardiology

## 2020-10-31 ENCOUNTER — Ambulatory Visit: Payer: PRIVATE HEALTH INSURANCE | Admitting: Cardiology

## 2020-10-31 VITALS — BP 138/78 | HR 77 | Ht 61.5 in | Wt 266.0 lb

## 2020-10-31 DIAGNOSIS — E119 Type 2 diabetes mellitus without complications: Secondary | ICD-10-CM

## 2020-10-31 DIAGNOSIS — E785 Hyperlipidemia, unspecified: Secondary | ICD-10-CM

## 2020-10-31 DIAGNOSIS — R9439 Abnormal result of other cardiovascular function study: Secondary | ICD-10-CM | POA: Diagnosis not present

## 2020-10-31 DIAGNOSIS — S20212S Contusion of left front wall of thorax, sequela: Secondary | ICD-10-CM | POA: Diagnosis not present

## 2020-10-31 NOTE — Addendum Note (Signed)
Addended by: Senaida Ores on: 10/31/2020 11:57 AM   Modules accepted: Orders

## 2020-10-31 NOTE — Addendum Note (Signed)
Addended by: Darrel Reach on: 10/31/2020 01:13 PM   Modules accepted: Orders
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
## 2020-10-31 NOTE — Patient Instructions (Signed)

## 2020-11-01 ENCOUNTER — Inpatient Hospital Stay: Payer: PRIVATE HEALTH INSURANCE | Attending: Oncology

## 2020-11-01 VITALS — BP 139/71 | HR 70 | Temp 98.7°F | Resp 18

## 2020-11-01 DIAGNOSIS — E538 Deficiency of other specified B group vitamins: Secondary | ICD-10-CM | POA: Diagnosis not present

## 2020-11-01 DIAGNOSIS — D519 Vitamin B12 deficiency anemia, unspecified: Secondary | ICD-10-CM

## 2020-11-01 LAB — LIPID PANEL
Chol/HDL Ratio: 3.8 ratio (ref 0.0–4.4)
Cholesterol, Total: 153 mg/dL (ref 100–199)
HDL: 40 mg/dL (ref >39)
LDL Chol Calc (NIH): 94 mg/dL (ref 0–99)
Triglycerides: 104 mg/dL (ref 0–149)
VLDL Cholesterol Cal: 19 mg/dL (ref 5–40)

## 2020-11-01 MED ORDER — CYANOCOBALAMIN 1000 MCG/ML IJ SOLN
INTRAMUSCULAR | Status: AC
  Filled 2020-11-01: qty 1

## 2020-11-01 MED ORDER — CYANOCOBALAMIN 1000 MCG/ML IJ SOLN
1000.0000 ug | Freq: Once | INTRAMUSCULAR | Status: AC
  Administered 2020-11-01: 1000 ug via INTRAMUSCULAR

## 2020-11-01 NOTE — Patient Instructions (Signed)

## 2020-11-01 NOTE — Progress Notes (Signed)
PT STABLE AT TIME OF DISCHARGE 

## 2020-11-22 ENCOUNTER — Ambulatory Visit: Payer: PRIVATE HEALTH INSURANCE

## 2020-11-29 ENCOUNTER — Inpatient Hospital Stay: Payer: PRIVATE HEALTH INSURANCE | Attending: Oncology

## 2020-11-29 ENCOUNTER — Other Ambulatory Visit: Payer: Self-pay

## 2020-11-29 DIAGNOSIS — D519 Vitamin B12 deficiency anemia, unspecified: Secondary | ICD-10-CM | POA: Diagnosis present

## 2020-11-29 MED ORDER — CYANOCOBALAMIN 1000 MCG/ML IJ SOLN
1000.0000 ug | Freq: Once | INTRAMUSCULAR | Status: AC
  Administered 2020-11-29: 1000 ug via INTRAMUSCULAR

## 2020-11-29 MED ORDER — CYANOCOBALAMIN 1000 MCG/ML IJ SOLN
INTRAMUSCULAR | Status: AC
  Filled 2020-11-29: qty 1

## 2020-11-29 NOTE — Progress Notes (Signed)
PT STABLE AT TIME OF DISCHARGE 

## 2020-11-29 NOTE — Patient Instructions (Signed)

## 2020-12-17 ENCOUNTER — Ambulatory Visit (HOSPITAL_COMMUNITY): Payer: PRIVATE HEALTH INSURANCE

## 2020-12-20 ENCOUNTER — Ambulatory Visit: Payer: PRIVATE HEALTH INSURANCE

## 2020-12-27 ENCOUNTER — Inpatient Hospital Stay: Payer: PRIVATE HEALTH INSURANCE | Attending: Oncology

## 2020-12-27 ENCOUNTER — Other Ambulatory Visit: Payer: Self-pay

## 2020-12-27 VITALS — BP 141/77 | HR 76 | Temp 99.0°F | Resp 18 | Ht 61.0 in | Wt 266.2 lb

## 2020-12-27 DIAGNOSIS — D519 Vitamin B12 deficiency anemia, unspecified: Secondary | ICD-10-CM | POA: Diagnosis not present

## 2020-12-27 MED ORDER — CYANOCOBALAMIN 1000 MCG/ML IJ SOLN
INTRAMUSCULAR | Status: AC
  Filled 2020-12-27: qty 1

## 2020-12-27 MED ORDER — CYANOCOBALAMIN 1000 MCG/ML IJ SOLN
1000.0000 ug | Freq: Once | INTRAMUSCULAR | Status: AC
  Administered 2020-12-27: 1000 ug via INTRAMUSCULAR

## 2020-12-27 NOTE — Patient Instructions (Signed)

## 2021-01-03 ENCOUNTER — Ambulatory Visit (HOSPITAL_COMMUNITY)
Admission: RE | Admit: 2021-01-03 | Discharge: 2021-01-03 | Disposition: A | Discharge status: Home / Self Care | Payer: PRIVATE HEALTH INSURANCE | Source: Ambulatory Visit | Attending: Gastroenterology | Admitting: Gastroenterology

## 2021-01-03 ENCOUNTER — Other Ambulatory Visit: Payer: Self-pay

## 2021-01-03 DIAGNOSIS — K580 Irritable bowel syndrome with diarrhea: Secondary | ICD-10-CM | POA: Diagnosis present

## 2021-01-03 DIAGNOSIS — R945 Abnormal results of liver function studies: Secondary | ICD-10-CM | POA: Insufficient documentation

## 2021-01-03 DIAGNOSIS — R7989 Other specified abnormal findings of blood chemistry: Secondary | ICD-10-CM

## 2021-01-03 DIAGNOSIS — K760 Fatty (change of) liver, not elsewhere classified: Secondary | ICD-10-CM | POA: Diagnosis present

## 2021-01-03 DIAGNOSIS — K219 Gastro-esophageal reflux disease without esophagitis: Secondary | ICD-10-CM | POA: Diagnosis present

## 2021-01-08 ENCOUNTER — Other Ambulatory Visit: Payer: Self-pay | Admitting: Oncology

## 2021-01-08 DIAGNOSIS — Z9889 Other specified postprocedural states: Secondary | ICD-10-CM

## 2021-01-10 NOTE — Progress Notes (Signed)
Please inform the patient. US-fatty liver, stable hemangioma USE: Median kPa: 6.3 s/o fatty liver.  No definite cirrhosis.  This is really good news. Still need to work on losing weight. Send report to family physician

## 2021-01-17 ENCOUNTER — Ambulatory Visit: Payer: PRIVATE HEALTH INSURANCE

## 2021-01-23 ENCOUNTER — Other Ambulatory Visit: Payer: Self-pay | Admitting: Oncology

## 2021-01-24 ENCOUNTER — Inpatient Hospital Stay: Payer: PRIVATE HEALTH INSURANCE | Attending: Oncology

## 2021-01-24 ENCOUNTER — Other Ambulatory Visit: Payer: Self-pay

## 2021-01-24 VITALS — BP 156/85 | HR 81 | Temp 98.2°F | Resp 18 | Ht 61.0 in | Wt 265.1 lb

## 2021-01-24 DIAGNOSIS — D519 Vitamin B12 deficiency anemia, unspecified: Secondary | ICD-10-CM

## 2021-01-24 MED ORDER — CYANOCOBALAMIN 1000 MCG/ML IJ SOLN
INTRAMUSCULAR | Status: AC
  Filled 2021-01-24: qty 1

## 2021-01-24 MED ORDER — CYANOCOBALAMIN 1000 MCG/ML IJ SOLN
1000.0000 ug | Freq: Once | INTRAMUSCULAR | Status: AC
  Administered 2021-01-24: 1000 ug via INTRAMUSCULAR

## 2021-01-24 NOTE — Patient Instructions (Signed)

## 2021-01-24 NOTE — Progress Notes (Signed)
Pt d/c at 1125 stable

## 2021-02-14 ENCOUNTER — Ambulatory Visit: Payer: PRIVATE HEALTH INSURANCE

## 2021-02-19 ENCOUNTER — Ambulatory Visit
Admission: RE | Admit: 2021-02-19 | Discharge: 2021-02-19 | Disposition: A | Discharge status: Home / Self Care | Payer: PRIVATE HEALTH INSURANCE | Source: Ambulatory Visit | Attending: Oncology | Admitting: Oncology

## 2021-02-19 ENCOUNTER — Other Ambulatory Visit: Payer: Self-pay

## 2021-02-19 DIAGNOSIS — Z9889 Other specified postprocedural states: Secondary | ICD-10-CM

## 2021-02-20 ENCOUNTER — Other Ambulatory Visit: Payer: Self-pay | Admitting: Pharmacist

## 2021-02-20 DIAGNOSIS — D519 Vitamin B12 deficiency anemia, unspecified: Secondary | ICD-10-CM

## 2021-02-21 ENCOUNTER — Inpatient Hospital Stay: Payer: PRIVATE HEALTH INSURANCE

## 2021-02-21 ENCOUNTER — Other Ambulatory Visit: Payer: Self-pay

## 2021-02-21 VITALS — BP 135/74 | HR 70 | Temp 98.1°F | Resp 18 | Ht 61.0 in | Wt 263.5 lb

## 2021-02-21 DIAGNOSIS — D519 Vitamin B12 deficiency anemia, unspecified: Secondary | ICD-10-CM | POA: Diagnosis not present

## 2021-02-21 MED ORDER — CYANOCOBALAMIN 1000 MCG/ML IJ SOLN
INTRAMUSCULAR | Status: AC
  Filled 2021-02-21: qty 1

## 2021-02-21 MED ORDER — CYANOCOBALAMIN 1000 MCG/ML IJ SOLN
1000.0000 ug | Freq: Once | INTRAMUSCULAR | Status: AC
  Administered 2021-02-21: 1000 ug via INTRAMUSCULAR

## 2021-02-21 NOTE — Patient Instructions (Signed)

## 2021-03-21 ENCOUNTER — Inpatient Hospital Stay: Payer: PRIVATE HEALTH INSURANCE | Admitting: Hematology and Oncology

## 2021-03-21 ENCOUNTER — Other Ambulatory Visit: Payer: Self-pay

## 2021-03-21 ENCOUNTER — Encounter: Payer: Self-pay | Admitting: Hematology and Oncology

## 2021-03-21 ENCOUNTER — Inpatient Hospital Stay: Payer: PRIVATE HEALTH INSURANCE | Attending: Oncology

## 2021-03-21 VITALS — BP 151/79 | HR 73 | Temp 98.1°F | Resp 18 | Ht 61.0 in | Wt 267.0 lb

## 2021-03-21 DIAGNOSIS — E538 Deficiency of other specified B group vitamins: Secondary | ICD-10-CM | POA: Insufficient documentation

## 2021-03-21 DIAGNOSIS — Z79811 Long term (current) use of aromatase inhibitors: Secondary | ICD-10-CM | POA: Diagnosis not present

## 2021-03-21 DIAGNOSIS — D519 Vitamin B12 deficiency anemia, unspecified: Secondary | ICD-10-CM | POA: Diagnosis not present

## 2021-03-21 DIAGNOSIS — C50812 Malignant neoplasm of overlapping sites of left female breast: Secondary | ICD-10-CM | POA: Diagnosis not present

## 2021-03-21 DIAGNOSIS — Z13820 Encounter for screening for osteoporosis: Secondary | ICD-10-CM | POA: Diagnosis not present

## 2021-03-21 DIAGNOSIS — Z170 Estrogen receptor positive status [ER+]: Secondary | ICD-10-CM

## 2021-03-21 LAB — CBC AND DIFFERENTIAL
HCT: 40 (ref 36–46)
Hemoglobin: 12.9 (ref 12.0–16.0)
Neutrophils Absolute: 5.54
Platelets: 219 (ref 150–399)
WBC: 8.4

## 2021-03-21 LAB — COMPREHENSIVE METABOLIC PANEL
Albumin: 4.2 (ref 3.5–5.0)
Calcium: 9.1 (ref 8.7–10.7)

## 2021-03-21 LAB — BASIC METABOLIC PANEL
BUN: 15 (ref 4–21)
CO2: 24 — AB (ref 13–22)
Chloride: 105 (ref 99–108)
Creatinine: 0.5 (ref 0.5–1.1)
Glucose: 146
Potassium: 4.1 (ref 3.4–5.3)
Sodium: 138 (ref 137–147)

## 2021-03-21 LAB — HEPATIC FUNCTION PANEL
ALT: 34 (ref 7–35)
AST: 30 (ref 13–35)
Alkaline Phosphatase: 101 (ref 25–125)
Bilirubin, Total: 0.4

## 2021-03-21 LAB — CBC
MCV: 81 (ref 81–99)
RBC: 5 (ref 3.87–5.11)
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
## 2021-03-22 ENCOUNTER — Inpatient Hospital Stay: Payer: PRIVATE HEALTH INSURANCE

## 2021-03-22 VITALS — BP 144/72 | HR 80 | Temp 98.9°F | Resp 18 | Ht 61.0 in | Wt 266.2 lb

## 2021-03-22 DIAGNOSIS — D519 Vitamin B12 deficiency anemia, unspecified: Secondary | ICD-10-CM

## 2021-03-22 DIAGNOSIS — E538 Deficiency of other specified B group vitamins: Secondary | ICD-10-CM | POA: Diagnosis present

## 2021-03-22 MED ORDER — CYANOCOBALAMIN 1000 MCG/ML IJ SOLN
1000.0000 ug | Freq: Once | INTRAMUSCULAR | Status: AC
  Administered 2021-03-22: 1000 ug via INTRAMUSCULAR

## 2021-03-22 MED ORDER — CYANOCOBALAMIN 1000 MCG/ML IJ SOLN
INTRAMUSCULAR | Status: AC
  Filled 2021-03-22: qty 1

## 2021-03-22 NOTE — Patient Instructions (Signed)

## 2021-04-01 DIAGNOSIS — Z780 Asymptomatic menopausal state: Secondary | ICD-10-CM | POA: Insufficient documentation

## 2021-04-01 DIAGNOSIS — M8580 Other specified disorders of bone density and structure, unspecified site: Secondary | ICD-10-CM | POA: Insufficient documentation

## 2021-04-01 HISTORY — DX: Asymptomatic menopausal state: Z78.0

## 2021-04-04 ENCOUNTER — Inpatient Hospital Stay: Payer: PRIVATE HEALTH INSURANCE | Attending: Oncology | Admitting: Hematology and Oncology

## 2021-04-04 ENCOUNTER — Encounter: Payer: Self-pay | Admitting: Hematology and Oncology

## 2021-04-04 ENCOUNTER — Other Ambulatory Visit: Payer: Self-pay

## 2021-04-04 ENCOUNTER — Other Ambulatory Visit: Payer: Self-pay | Admitting: Pharmacist

## 2021-04-04 VITALS — BP 155/77 | HR 75 | Temp 98.4°F | Resp 18 | Ht 61.0 in | Wt 268.9 lb

## 2021-04-04 DIAGNOSIS — Z780 Asymptomatic menopausal state: Secondary | ICD-10-CM | POA: Diagnosis not present

## 2021-04-04 DIAGNOSIS — Z79811 Long term (current) use of aromatase inhibitors: Secondary | ICD-10-CM | POA: Insufficient documentation

## 2021-04-04 DIAGNOSIS — C50812 Malignant neoplasm of overlapping sites of left female breast: Secondary | ICD-10-CM

## 2021-04-04 DIAGNOSIS — M8580 Other specified disorders of bone density and structure, unspecified site: Secondary | ICD-10-CM | POA: Diagnosis not present

## 2021-04-04 DIAGNOSIS — Z170 Estrogen receptor positive status [ER+]: Secondary | ICD-10-CM

## 2021-04-04 DIAGNOSIS — E538 Deficiency of other specified B group vitamins: Secondary | ICD-10-CM | POA: Insufficient documentation
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
## 2021-04-05 ENCOUNTER — Other Ambulatory Visit: Payer: Self-pay

## 2021-04-05 ENCOUNTER — Inpatient Hospital Stay: Payer: PRIVATE HEALTH INSURANCE

## 2021-04-05 VITALS — BP 143/85 | HR 87 | Temp 98.6°F | Resp 18 | Ht 61.0 in | Wt 265.8 lb

## 2021-04-05 DIAGNOSIS — M8580 Other specified disorders of bone density and structure, unspecified site: Secondary | ICD-10-CM | POA: Diagnosis not present

## 2021-04-05 DIAGNOSIS — E538 Deficiency of other specified B group vitamins: Secondary | ICD-10-CM | POA: Diagnosis not present

## 2021-04-05 DIAGNOSIS — D519 Vitamin B12 deficiency anemia, unspecified: Secondary | ICD-10-CM

## 2021-04-05 DIAGNOSIS — Z79811 Long term (current) use of aromatase inhibitors: Secondary | ICD-10-CM | POA: Diagnosis not present

## 2021-04-05 MED ORDER — DENOSUMAB 60 MG/ML ~~LOC~~ SOSY
PREFILLED_SYRINGE | SUBCUTANEOUS | Status: AC
  Filled 2021-04-05: qty 1

## 2021-04-05 MED ORDER — DENOSUMAB 60 MG/ML ~~LOC~~ SOSY
60.0000 mg | PREFILLED_SYRINGE | Freq: Once | SUBCUTANEOUS | Status: AC
  Administered 2021-04-05: 60 mg via SUBCUTANEOUS

## 2021-04-05 NOTE — Patient Instructions (Signed)
Denosumab injection What is this medicine? DENOSUMAB (den oh sue mab) slows bone breakdown. Prolia is used to treat osteoporosis in women after menopause and in men, and in people who are taking corticosteroids for 6 months or more. Delton See is used to treat a high calcium level due to cancer and to prevent bone fractures and other bone problems caused by multiple myeloma or cancer bone metastases. Delton See is also used to treat giant cell tumor of the bone. This medicine may be used for other purposes; ask your health care provider or pharmacist if you have questions. COMMON BRAND NAME(S): Prolia, XGEVA What should I tell my health care provider before I take this medicine? They need to know if you have any of these conditions:  dental disease  having surgery or tooth extraction  infection  kidney disease  low levels of calcium or Vitamin D in the blood  malnutrition  on hemodialysis  skin conditions or sensitivity  thyroid or parathyroid disease  an unusual reaction to denosumab, other medicines, foods, dyes, or preservatives  pregnant or trying to get pregnant  breast-feeding How should I use this medicine? This medicine is for injection under the skin. It is given by a health care professional in a hospital or clinic setting. A special MedGuide will be given to you before each treatment. Be sure to read this information carefully each time. For Prolia, talk to your pediatrician regarding the use of this medicine in children. Special care may be needed. For Delton See, talk to your pediatrician regarding the use of this medicine in children. While this drug may be prescribed for children as young as 13 years for selected conditions, precautions do apply. Overdosage: If you think you have taken too much of this medicine contact a poison control center or emergency room at once. NOTE: This medicine is only for you. Do not share this medicine with others. What if I miss a dose? It is  important not to miss your dose. Call your doctor or health care professional if you are unable to keep an appointment. What may interact with this medicine? Do not take this medicine with any of the following medications:  other medicines containing denosumab This medicine may also interact with the following medications:  medicines that lower your chance of fighting infection  steroid medicines like prednisone or cortisone This list may not describe all possible interactions. Give your health care provider a list of all the medicines, herbs, non-prescription drugs, or dietary supplements you use. Also tell them if you smoke, drink alcohol, or use illegal drugs. Some items may interact with your medicine. What should I watch for while using this medicine? Visit your doctor or health care professional for regular checks on your progress. Your doctor or health care professional may order blood tests and other tests to see how you are doing. Call your doctor or health care professional for advice if you get a fever, chills or sore throat, or other symptoms of a cold or flu. Do not treat yourself. This drug may decrease your body's ability to fight infection. Try to avoid being around people who are sick. You should make sure you get enough calcium and vitamin D while you are taking this medicine, unless your doctor tells you not to. Discuss the foods you eat and the vitamins you take with your health care professional. See your dentist regularly. Brush and floss your teeth as directed. Before you have any dental work done, tell your dentist you are  receiving this medicine. Do not become pregnant while taking this medicine or for 5 months after stopping it. Talk with your doctor or health care professional about your birth control options while taking this medicine. Women should inform their doctor if they wish to become pregnant or think they might be pregnant. There is a potential for serious side  effects to an unborn child. Talk to your health care professional or pharmacist for more information. What side effects may I notice from receiving this medicine? Side effects that you should report to your doctor or health care professional as soon as possible:  allergic reactions like skin rash, itching or hives, swelling of the face, lips, or tongue  bone pain  breathing problems  dizziness  jaw pain, especially after dental work  redness, blistering, peeling of the skin  signs and symptoms of infection like fever or chills; cough; sore throat; pain or trouble passing urine  signs of low calcium like fast heartbeat, muscle cramps or muscle pain; pain, tingling, numbness in the hands or feet; seizures  unusual bleeding or bruising  unusually weak or tired Side effects that usually do not require medical attention (report to your doctor or health care professional if they continue or are bothersome):  constipation  diarrhea  headache  joint pain  loss of appetite  muscle pain  runny nose  tiredness  upset stomach This list may not describe all possible side effects. Call your doctor for medical advice about side effects. You may report side effects to FDA at 1-800-FDA-1088. Where should I keep my medicine? This medicine is only given in a clinic, doctor's office, or other health care setting and will not be stored at home. NOTE: This sheet is a summary. It may not cover all possible information. If you have questions about this medicine, talk to your doctor, pharmacist, or health care provider.  2021 Elsevier/Gold Standard (2018-03-19 16:10:44)

## 2021-04-09 ENCOUNTER — Encounter: Payer: Self-pay | Admitting: Hematology and Oncology

## 2021-04-19 ENCOUNTER — Inpatient Hospital Stay: Payer: PRIVATE HEALTH INSURANCE

## 2021-04-19 ENCOUNTER — Other Ambulatory Visit: Payer: Self-pay

## 2021-04-19 VITALS — BP 131/71 | HR 82 | Temp 98.1°F | Resp 18 | Ht 61.0 in | Wt 264.2 lb

## 2021-04-19 DIAGNOSIS — D519 Vitamin B12 deficiency anemia, unspecified: Secondary | ICD-10-CM

## 2021-04-19 DIAGNOSIS — M8580 Other specified disorders of bone density and structure, unspecified site: Secondary | ICD-10-CM | POA: Diagnosis not present

## 2021-04-19 MED ORDER — CYANOCOBALAMIN 1000 MCG/ML IJ SOLN
1000.0000 ug | Freq: Once | INTRAMUSCULAR | Status: AC
  Administered 2021-04-19: 1000 ug via INTRAMUSCULAR

## 2021-04-19 MED ORDER — CYANOCOBALAMIN 1000 MCG/ML IJ SOLN
INTRAMUSCULAR | Status: AC
  Filled 2021-04-19: qty 1

## 2021-04-19 NOTE — Patient Instructions (Signed)
[facility] [place]    , [name]    Phone:      Apr 19, 2021  Patient: Anne Lawrence  Date of Birth: Jan 24, 1966  Date of Visit: Apr 19, 2021    To Whom It May Concern:  Anne Lawrence was seen and treated on Apr 19, 2021 in the office for shot.              If you have any questions or concerns, please don't hesitate to call.   Sincerely,  Dara Hoyer     Treatment Team:  Attending Provider: Marvia Pickles, PA-C       Cyanocobalamin, Vitamin B12 injection What is this medicine? CYANOCOBALAMIN (sye an oh koe BAL a min) is a man made form of vitamin B12. Vitamin B12 is used in the growth of healthy blood cells, nerve cells, and proteins in the body. It also helps with the metabolism of fats and carbohydrates. This medicine is used to treat people who can not absorb vitamin B12. This medicine may be used for other purposes; ask your health care provider or pharmacist if you have questions. COMMON BRAND NAME(S): B-12 Compliance Kit, B-12 Injection Kit, Cyomin, LA-12, Nutri-Twelve, Physicians EZ Use B-12, Primabalt What should I tell my health care provider before I take this medicine? They need to know if you have any of these conditions:  kidney disease  Leber's disease  megaloblastic anemia  an unusual or allergic reaction to cyanocobalamin, cobalt, other medicines, foods, dyes, or preservatives  pregnant or trying to get pregnant  breast-feeding How should I use this medicine? This medicine is injected into a muscle or deeply under the skin. It is usually given by a health care professional in a clinic or doctor's office. However, your doctor may teach you how to inject yourself. Follow all instructions. Talk to your pediatrician regarding the use of this medicine in children. Special care may be needed. Overdosage: If you think you have taken too much of this medicine contact a poison control center or emergency room at  once. NOTE: This medicine is only for you. Do not share this medicine with others. What if I miss a dose? If you are given your dose at a clinic or doctor's office, call to reschedule your appointment. If you give your own injections and you miss a dose, take it as soon as you can. If it is almost time for your next dose, take only that dose. Do not take double or extra doses. What may interact with this medicine?  colchicine  heavy alcohol intake This list may not describe all possible interactions. Give your health care provider a list of all the medicines, herbs, non-prescription drugs, or dietary supplements you use. Also tell them if you smoke, drink alcohol, or use illegal drugs. Some items may interact with your medicine. What should I watch for while using this medicine? Visit your doctor or health care professional regularly. You may need blood work done while you are taking this medicine. You may need to follow a special diet. Talk to your doctor. Limit your alcohol intake and avoid smoking to get the best benefit. What side effects may I notice from receiving this medicine? Side effects that you should report to your doctor or health care professional as soon as possible:  allergic reactions like skin rash, itching or hives, swelling of the face, lips, or tongue  blue tint to skin  chest tightness, pain  difficulty breathing, wheezing  dizziness  red, swollen painful area on the leg Side effects that usually do not require medical attention (report to your doctor or health care professional if they continue or are bothersome):  diarrhea  headache This list may not describe all possible side effects. Call your doctor for medical advice about side effects. You may report side effects to FDA at 1-800-FDA-1088. Where should I keep my medicine? Keep out of the reach of children. Store at room temperature between 15 and 30 degrees C (59 and 85 degrees F). Protect from light.  Throw away any unused medicine after the expiration date. NOTE: This sheet is a summary. It may not cover all possible information. If you have questions about this medicine, talk to your doctor, pharmacist, or health care provider.  2021 Elsevier/Gold Standard (2008-02-21 22:10:20)

## 2021-04-29 ENCOUNTER — Telehealth: Payer: Self-pay | Admitting: Cardiology

## 2021-04-29 DIAGNOSIS — R0789 Other chest pain: Secondary | ICD-10-CM | POA: Insufficient documentation

## 2021-04-29 DIAGNOSIS — S2239XA Fracture of one rib, unspecified side, initial encounter for closed fracture: Secondary | ICD-10-CM | POA: Insufficient documentation

## 2021-04-29 NOTE — Telephone Encounter (Signed)
Pt is calling in regards to having a Prolia injection from the [facility] on 04/05/21, pt was informed by [facility] staff that she should wait a while before she has any labs done by our office. Pt states she is supposed to have fasting labs this week 05/02/21 and she wants to know how long she should wait. Please advise

## 2021-04-30 NOTE — Telephone Encounter (Signed)
Pt is calling back, has not yet been contacted regarding the encounter yesterday, please follow up

## 2021-05-01 NOTE — Telephone Encounter (Signed)
Left message for patient to return call.

## 2021-05-01 NOTE — Telephone Encounter (Signed)
Call patient informed her of message below. No further questions.

## 2021-05-02 ENCOUNTER — Encounter: Payer: Self-pay | Admitting: Cardiology

## 2021-05-02 ENCOUNTER — Other Ambulatory Visit: Payer: Self-pay

## 2021-05-02 ENCOUNTER — Ambulatory Visit: Payer: PRIVATE HEALTH INSURANCE | Admitting: Cardiology

## 2021-05-02 VITALS — BP 138/96 | HR 76 | Ht 61.0 in | Wt 266.0 lb

## 2021-05-02 DIAGNOSIS — E119 Type 2 diabetes mellitus without complications: Secondary | ICD-10-CM

## 2021-05-02 DIAGNOSIS — R0789 Other chest pain: Secondary | ICD-10-CM

## 2021-05-02 DIAGNOSIS — E785 Hyperlipidemia, unspecified: Secondary | ICD-10-CM | POA: Diagnosis not present

## 2021-05-02 DIAGNOSIS — R9439 Abnormal result of other cardiovascular function study: Secondary | ICD-10-CM | POA: Diagnosis not present

## 2021-05-02 DIAGNOSIS — Z170 Estrogen receptor positive status [ER+]: Secondary | ICD-10-CM

## 2021-05-02 DIAGNOSIS — C50912 Malignant neoplasm of unspecified site of left female breast: Secondary | ICD-10-CM | POA: Diagnosis not present
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
## 2021-05-02 NOTE — Patient Instructions (Signed)
Medication Instructions:  Your physician recommends that you continue on your current medications as directed. Please refer to the Current Medication list given to you today.  *If you need a refill on your cardiac medications before your next appointment, please call your pharmacy*   Lab Work: Your physician recommends that you return for lab work in  6 weeks: lipid  If you have labs (blood work) drawn today and your tests are completely normal, you will receive your results only by: [place] (if you have MyChart) OR A paper copy in the mail If you have any lab test that is abnormal or we need to change your treatment, we will call you to review the results.   Testing/Procedures: None   Follow-Up: At [facility], you and your health needs are our priority.  As part of our continuing mission to provide you with exceptional heart care, we have created designated Provider Care Teams.  These Care Teams include your primary Cardiologist (physician) and Advanced Practice Providers (APPs -  Physician Assistants and Nurse Practitioners) who all work together to provide you with the care you need, when you need it.  We recommend signing up for the patient portal called "MyChart".  Sign up information is provided on this After Visit Summary.  MyChart is used to connect with patients for Virtual Visits (Telemedicine).  Patients are able to view lab/test results, encounter notes, upcoming appointments, etc.  Non-urgent messages can be sent to your provider as well.   To learn more about what you can do with MyChart, go to [email or web address].    Your next appointment:   6 month(s)  The format for your next appointment:   In Person  Provider:   Jenne Campus, MD   Other Instructions

## 2021-05-17 ENCOUNTER — Encounter: Payer: Self-pay | Admitting: Oncology

## 2021-05-17 ENCOUNTER — Other Ambulatory Visit: Payer: Self-pay

## 2021-05-17 ENCOUNTER — Inpatient Hospital Stay: Payer: No Typology Code available for payment source | Attending: Oncology

## 2021-05-17 VITALS — BP 141/73 | HR 83 | Temp 98.1°F | Resp 18 | Ht 61.0 in | Wt 266.1 lb

## 2021-05-17 DIAGNOSIS — E538 Deficiency of other specified B group vitamins: Secondary | ICD-10-CM | POA: Diagnosis present

## 2021-05-17 DIAGNOSIS — D519 Vitamin B12 deficiency anemia, unspecified: Secondary | ICD-10-CM

## 2021-05-17 MED ORDER — CYANOCOBALAMIN 1000 MCG/ML IJ SOLN
1000.0000 ug | Freq: Once | INTRAMUSCULAR | Status: AC
  Administered 2021-05-17: 1000 ug via INTRAMUSCULAR

## 2021-05-17 MED ORDER — CYANOCOBALAMIN 1000 MCG/ML IJ SOLN
INTRAMUSCULAR | Status: AC
  Filled 2021-05-17: qty 1

## 2021-05-17 NOTE — Progress Notes (Signed)
Pt d/c stable at 1151

## 2021-05-17 NOTE — Patient Instructions (Signed)

## 2021-06-14 ENCOUNTER — Inpatient Hospital Stay: Payer: No Typology Code available for payment source | Attending: Oncology

## 2021-06-14 ENCOUNTER — Other Ambulatory Visit: Payer: Self-pay

## 2021-06-14 VITALS — BP 138/85 | HR 81 | Temp 98.4°F | Resp 18 | Ht 61.0 in | Wt 268.8 lb

## 2021-06-14 DIAGNOSIS — D519 Vitamin B12 deficiency anemia, unspecified: Secondary | ICD-10-CM

## 2021-06-14 DIAGNOSIS — E538 Deficiency of other specified B group vitamins: Secondary | ICD-10-CM | POA: Diagnosis not present

## 2021-06-14 MED ORDER — CYANOCOBALAMIN 1000 MCG/ML IJ SOLN
INTRAMUSCULAR | Status: AC
  Filled 2021-06-14: qty 1

## 2021-06-14 MED ORDER — SODIUM CHLORIDE 0.9 % IV SOLN
Freq: Once | INTRAVENOUS | Status: DC
  Filled 2021-06-14: qty 250

## 2021-06-14 MED ORDER — CYANOCOBALAMIN 1000 MCG/ML IJ SOLN
1000.0000 ug | Freq: Once | INTRAMUSCULAR | Status: AC
  Administered 2021-06-14: 1000 ug via INTRAMUSCULAR

## 2021-06-14 NOTE — Patient Instructions (Signed)
Vitamin B12 Injection What is this medication? Vitamin B12 (VAHY tuh min B12) prevents and treats low vitamin B12 levels in your body. It is used in people who do not get enough vitamin B12 from their diet or when their digestive tract does not absorb enough. Vitamin B12 plays an important role in maintaining the health of your nervous system and red bloodcells. This medicine may be used for other purposes; ask your health care provider orpharmacist if you have questions. COMMON BRAND NAME(S): B-12 Compliance Kit, B-12 Injection Kit, Cyomin, LA-12,Nutri-Twelve, Physicians EZ Use B-12, Primabalt What should I tell my care team before I take this medication? They need to know if you have any of these conditions: Kidney disease Leber's disease Megaloblastic anemia An unusual or allergic reaction to cyanocobalamin, cobalt, other medications, foods, dyes, or preservatives Pregnant or trying to get pregnant Breast-feeding How should I use this medication? This medication is injected into a muscle or deeply under the skin. It is usually given in a clinic or care team's office. However, your care team mayteach you how to inject yourself. Follow all instructions. Talk to your care team about the use of this medication in children. Specialcare may be needed. Overdosage: If you think you have taken too much of this medicine contact apoison control center or emergency room at once. NOTE: This medicine is only for you. Do not share this medicine with others. What if I miss a dose? If you are given your dose at a clinic or care team's office, call to reschedule your appointment. If you give your own injections, and you miss a dose, take it as soon as you can. If it is almost time for your next dose, takeonly that dose. Do not take double or extra doses. What may interact with this medication? Colchicine Heavy alcohol intake This list may not describe all possible interactions. Give your health care provider  a list of all the medicines, herbs, non-prescription drugs, or dietary supplements you use. Also tell them if you smoke, drink alcohol, or use illegaldrugs. Some items may interact with your medicine. What should I watch for while using this medication? Visit your care team regularly. You may need blood work done while you aretaking this medication. You may need to follow a special diet. Talk to your care team. Limit youralcohol intake and avoid smoking to get the best benefit. What side effects may I notice from receiving this medication? Side effects that you should report to your care team as soon as possible: Allergic reactions-skin rash, itching, hives, swelling of the face, lips, tongue, or throat Swelling of the ankles, hands, or feet Trouble breathing Side effects that usually do not require medical attention (report to your careteam if they continue or are bothersome): Diarrhea This list may not describe all possible side effects. Call your doctor for medical advice about side effects. You may report side effects to FDA at1-800-FDA-1088. Where should I keep my medication? Keep out of the reach of children. Store at room temperature between 15 and 30 degrees C (59 and 85 degrees F).Protect from light. Throw away any unused medication after the expiration date. NOTE: This sheet is a summary. It may not cover all possible information. If you have questions about this medicine, talk to your doctor, pharmacist, orhealth care provider.  2022 Elsevier/Gold Standard (2020-12-31 11:47:06)  

## 2021-07-08 ENCOUNTER — Other Ambulatory Visit: Payer: Self-pay | Admitting: Hematology and Oncology

## 2021-07-08 DIAGNOSIS — C50812 Malignant neoplasm of overlapping sites of left female breast: Secondary | ICD-10-CM

## 2021-07-08 DIAGNOSIS — Z170 Estrogen receptor positive status [ER+]: Secondary | ICD-10-CM

## 2021-07-09 ENCOUNTER — Encounter: Payer: Self-pay | Admitting: Oncology

## 2021-07-12 ENCOUNTER — Inpatient Hospital Stay: Payer: No Typology Code available for payment source | Attending: Oncology

## 2021-07-12 ENCOUNTER — Other Ambulatory Visit: Payer: Self-pay

## 2021-07-12 VITALS — BP 132/73 | HR 85 | Temp 98.9°F | Resp 18 | Ht 61.0 in | Wt 269.2 lb

## 2021-07-12 DIAGNOSIS — E538 Deficiency of other specified B group vitamins: Secondary | ICD-10-CM | POA: Insufficient documentation

## 2021-07-12 DIAGNOSIS — D519 Vitamin B12 deficiency anemia, unspecified: Secondary | ICD-10-CM

## 2021-07-12 MED ORDER — CYANOCOBALAMIN 1000 MCG/ML IJ SOLN
1000.0000 ug | Freq: Once | INTRAMUSCULAR | Status: AC
  Administered 2021-07-12: 1000 ug via INTRAMUSCULAR
  Filled 2021-07-12: qty 1

## 2021-07-12 NOTE — Patient Instructions (Signed)
Vitamin B12 Injection What is this medication? Vitamin B12 (VAHY tuh min B12) prevents and treats low vitamin B12 levels in your body. It is used in people who do not get enough vitamin B12 from their diet or when their digestive tract does not absorb enough. Vitamin B12 plays an important role in maintaining the health of your nervous system and red bloodcells. This medicine may be used for other purposes; ask your health care provider orpharmacist if you have questions. COMMON BRAND NAME(S): B-12 Compliance Kit, B-12 Injection Kit, Cyomin, LA-12,Nutri-Twelve, Physicians EZ Use B-12, Primabalt What should I tell my care team before I take this medication? They need to know if you have any of these conditions: Kidney disease Leber's disease Megaloblastic anemia An unusual or allergic reaction to cyanocobalamin, cobalt, other medications, foods, dyes, or preservatives Pregnant or trying to get pregnant Breast-feeding How should I use this medication? This medication is injected into a muscle or deeply under the skin. It is usually given in a clinic or care team's office. However, your care team mayteach you how to inject yourself. Follow all instructions. Talk to your care team about the use of this medication in children. Specialcare may be needed. Overdosage: If you think you have taken too much of this medicine contact apoison control center or emergency room at once. NOTE: This medicine is only for you. Do not share this medicine with others. What if I miss a dose? If you are given your dose at a clinic or care team's office, call to reschedule your appointment. If you give your own injections, and you miss a dose, take it as soon as you can. If it is almost time for your next dose, takeonly that dose. Do not take double or extra doses. What may interact with this medication? Colchicine Heavy alcohol intake This list may not describe all possible interactions. Give your health care provider  a list of all the medicines, herbs, non-prescription drugs, or dietary supplements you use. Also tell them if you smoke, drink alcohol, or use illegaldrugs. Some items may interact with your medicine. What should I watch for while using this medication? Visit your care team regularly. You may need blood work done while you aretaking this medication. You may need to follow a special diet. Talk to your care team. Limit youralcohol intake and avoid smoking to get the best benefit. What side effects may I notice from receiving this medication? Side effects that you should report to your care team as soon as possible: Allergic reactions-skin rash, itching, hives, swelling of the face, lips, tongue, or throat Swelling of the ankles, hands, or feet Trouble breathing Side effects that usually do not require medical attention (report to your careteam if they continue or are bothersome): Diarrhea This list may not describe all possible side effects. Call your doctor for medical advice about side effects. You may report side effects to FDA at1-800-FDA-1088. Where should I keep my medication? Keep out of the reach of children. Store at room temperature between 15 and 30 degrees C (59 and 85 degrees F).Protect from light. Throw away any unused medication after the expiration date. NOTE: This sheet is a summary. It may not cover all possible information. If you have questions about this medicine, talk to your doctor, pharmacist, orhealth care provider.  2022 Elsevier/Gold Standard (2020-12-31 11:47:06)  

## 2021-08-09 ENCOUNTER — Other Ambulatory Visit: Payer: Self-pay

## 2021-08-09 ENCOUNTER — Inpatient Hospital Stay: Payer: No Typology Code available for payment source | Attending: Oncology

## 2021-08-09 VITALS — BP 118/71 | HR 80 | Temp 98.9°F | Resp 18 | Ht 61.0 in | Wt 263.5 lb

## 2021-08-09 DIAGNOSIS — E538 Deficiency of other specified B group vitamins: Secondary | ICD-10-CM | POA: Insufficient documentation

## 2021-08-09 DIAGNOSIS — D519 Vitamin B12 deficiency anemia, unspecified: Secondary | ICD-10-CM

## 2021-08-09 MED ORDER — CYANOCOBALAMIN 1000 MCG/ML IJ SOLN
1000.0000 ug | Freq: Once | INTRAMUSCULAR | Status: AC
  Administered 2021-08-09: 1000 ug via INTRAMUSCULAR
  Filled 2021-08-09: qty 1

## 2021-08-09 NOTE — Patient Instructions (Signed)

## 2021-09-06 ENCOUNTER — Inpatient Hospital Stay: Payer: No Typology Code available for payment source | Attending: Oncology

## 2021-09-06 ENCOUNTER — Other Ambulatory Visit: Payer: Self-pay | Admitting: Pharmacist

## 2021-09-06 ENCOUNTER — Other Ambulatory Visit: Payer: Self-pay

## 2021-09-06 VITALS — BP 157/69 | HR 68 | Temp 98.8°F | Resp 18 | Ht 61.0 in | Wt 269.2 lb

## 2021-09-06 DIAGNOSIS — E538 Deficiency of other specified B group vitamins: Secondary | ICD-10-CM | POA: Insufficient documentation

## 2021-09-06 DIAGNOSIS — D519 Vitamin B12 deficiency anemia, unspecified: Secondary | ICD-10-CM

## 2021-09-06 MED ORDER — CYANOCOBALAMIN 1000 MCG/ML IJ SOLN
1000.0000 ug | Freq: Once | INTRAMUSCULAR | Status: AC
Start: 2021-09-06 — End: 2021-09-06
  Administered 2021-09-06: 1000 ug via INTRAMUSCULAR
  Filled 2021-09-06: qty 1

## 2021-09-06 NOTE — Progress Notes (Signed)
1212: PT STABLE AT TIME OF DISCHARGE

## 2021-09-06 NOTE — Patient Instructions (Signed)

## 2021-09-19 LAB — LIPID PANEL
Chol/HDL Ratio: 3.7 ratio (ref 0.0–4.4)
Cholesterol, Total: 140 mg/dL (ref 100–199)
HDL: 38 mg/dL — ABNORMAL LOW (ref >39)
LDL Chol Calc (NIH): 86 mg/dL (ref 0–99)
Triglycerides: 81 mg/dL (ref 0–149)
VLDL Cholesterol Cal: 16 mg/dL (ref 5–40)

## 2021-09-26 ENCOUNTER — Encounter: Payer: Self-pay | Admitting: Oncology

## 2021-09-26 ENCOUNTER — Telehealth: Payer: Self-pay | Admitting: Cardiology

## 2021-09-26 NOTE — Telephone Encounter (Signed)
Called patient informed her of results. See note.

## 2021-09-26 NOTE — Telephone Encounter (Signed)
Follow Up:    Patient is returning Hayley's call, concerning her lab results.

## 2021-09-27 ENCOUNTER — Other Ambulatory Visit: Payer: Self-pay | Admitting: Hematology and Oncology

## 2021-09-27 DIAGNOSIS — C50812 Malignant neoplasm of overlapping sites of left female breast: Secondary | ICD-10-CM

## 2021-09-27 DIAGNOSIS — Z170 Estrogen receptor positive status [ER+]: Secondary | ICD-10-CM

## 2021-09-30 ENCOUNTER — Telehealth: Payer: Self-pay | Admitting: Oncology

## 2021-09-30 ENCOUNTER — Encounter: Payer: Self-pay | Admitting: Hematology and Oncology

## 2021-09-30 ENCOUNTER — Inpatient Hospital Stay: Payer: No Typology Code available for payment source | Admitting: Hematology and Oncology

## 2021-09-30 ENCOUNTER — Inpatient Hospital Stay: Payer: No Typology Code available for payment source | Attending: Oncology

## 2021-09-30 VITALS — BP 161/74 | HR 75 | Temp 98.7°F | Resp 16 | Ht 61.0 in | Wt 263.2 lb

## 2021-09-30 DIAGNOSIS — D519 Vitamin B12 deficiency anemia, unspecified: Secondary | ICD-10-CM | POA: Insufficient documentation

## 2021-09-30 DIAGNOSIS — M8580 Other specified disorders of bone density and structure, unspecified site: Secondary | ICD-10-CM | POA: Diagnosis not present

## 2021-09-30 DIAGNOSIS — Z13820 Encounter for screening for osteoporosis: Secondary | ICD-10-CM | POA: Diagnosis not present

## 2021-09-30 DIAGNOSIS — C50812 Malignant neoplasm of overlapping sites of left female breast: Secondary | ICD-10-CM

## 2021-09-30 DIAGNOSIS — C50919 Malignant neoplasm of unspecified site of unspecified female breast: Secondary | ICD-10-CM | POA: Diagnosis not present

## 2021-09-30 DIAGNOSIS — J309 Allergic rhinitis, unspecified: Secondary | ICD-10-CM

## 2021-09-30 DIAGNOSIS — Z780 Asymptomatic menopausal state: Secondary | ICD-10-CM

## 2021-09-30 DIAGNOSIS — Z170 Estrogen receptor positive status [ER+]: Secondary | ICD-10-CM

## 2021-09-30 LAB — HEPATIC FUNCTION PANEL
ALT: 33 (ref 7–35)
AST: 36 — AB (ref 13–35)
Alkaline Phosphatase: 82 (ref 25–125)
Bilirubin, Total: 0.5

## 2021-09-30 LAB — BASIC METABOLIC PANEL
BUN: 11 (ref 4–21)
CO2: 28 — AB (ref 13–22)
Chloride: 104 (ref 99–108)
Creatinine: 0.6 (ref 0.5–1.1)
Glucose: 124
Potassium: 4.2 (ref 3.4–5.3)
Sodium: 141 (ref 137–147)

## 2021-09-30 LAB — CBC AND DIFFERENTIAL
HCT: 40 (ref 36–46)
Hemoglobin: 13.2 (ref 12.0–16.0)
Neutrophils Absolute: 5.89
Platelets: 236 (ref 150–399)
WBC: 9.2

## 2021-09-30 LAB — COMPREHENSIVE METABOLIC PANEL
Albumin: 4.2 (ref 3.5–5.0)
Calcium: 9 (ref 8.7–10.7)

## 2021-09-30 LAB — CBC: RBC: 5.07 (ref 3.87–5.11)

## 2021-09-30 NOTE — Telephone Encounter (Signed)
Per 1/17 los next appt scheduled and given to  patient °

## 2021-09-30 NOTE — Progress Notes (Signed)
Patient Care Team: Myrlene Broker, MD as PCP - General (Family Medicine) Park Liter, MD as PCP - Cardiology (Cardiology) Derwood Kaplan, MD as PCP - Hematology/Oncology (Oncology)  Clinic Day:  09/30/2021  Referring physician: Myrlene Broker, MD  ASSESSMENT & PLAN:   Assessment & Plan: Breast cancer [facility]) She continues with daily anastrozole without difficulty. She has no signs of recurrence of disease. We will see her back in 6 months for repeat evaluation.   Osteopenia after menopause She will proceed with denosumab every 6 months. We will delay this month due to dental work. She will have her next injection in December.   Vitamin B12 deficiency anemia, unspecified She will proceed with monthly B12 injections.  Allergic sinusitis She continues to have pain in her hands, but relates this to her diagnosis of arthritis and is managed by rheumatology, although she hasn't seen them lately.    The patient understands the plans discussed today and is in agreement with them.  She knows to contact our office if she develops concerns prior to her next appointment.    [name], NP  [facility] [address] [name] [address] Dept: [phone] Dept Fax: [phone]   Orders Placed This Encounter  Procedures   CMP ([facility] scanned report) STAT    Standing Status:   Future    Standing Expiration Date:   09/30/2022      CHIEF COMPLAINT:  CC: A 55 year old female with history of breast cancer here for 6 month evaluation.  Current Treatment:  Vitamin B12 monthly/ denosumab every 6 months.  INTERVAL HISTORY:  Anne Lawrence is here today for repeat clinical assessment. She denies fevers or chills. She denies pain. Her appetite is good. Her weight has decreased 6 pounds over last 6 months .  I have reviewed the past medical history, past surgical history, social history and family  history with the patient and they are unchanged from previous note.  ALLERGIES:  is allergic to sulfamethoxazole-trimethoprim.  MEDICATIONS:  Current Outpatient Medications  Medication Sig Dispense Refill   acetaminophen (TYLENOL) 500 MG tablet Take 500 mg by mouth every 6 (six) hours as needed for mild pain or moderate pain.     anastrozole (ARIMIDEX) 1 MG tablet TAKE ONE TABLET BY MOUTH DAILY 30 tablet 5   Ascorbic Acid (VITAMIN C) 100 MG tablet Take 100 mg by mouth daily.     Carboxymethylcellul-Glycerin (LUBRICATING EYE DROPS OP) Apply 1 drop to eye daily as needed (dry eyes). Unknown strength     cholecalciferol (VITAMIN D3) 25 MCG (1000 UNIT) tablet Take 1,000 Units by mouth daily.     cholestyramine light (PREVALITE) 4 GM/DOSE powder Take 1 g by mouth daily.     Cyanocobalamin (B-12 COMPLIANCE INJECTION IJ) Inject 100 mg as directed every 30 (thirty) days.     denosumab (PROLIA) 60 MG/ML SOSY injection Inject 60 mg into the skin every 6 (six) months.     esomeprazole (NEXIUM) 40 MG capsule Take 1 capsule (40 mg total) by mouth daily. 30 capsule 11   fluticasone (FLONASE) 50 MCG/ACT nasal spray Place 2 sprays into both nostrils daily.     ibuprofen (ADVIL,MOTRIN) 200 MG tablet Take 200 mg by mouth every 6 (six) hours as needed for mild pain, moderate pain or cramping (takes 3 tablets).     ipratropium (ATROVENT) 0.06 % nasal spray Place 2 sprays into both nostrils 3 (three) times daily.  levocetirizine (XYZAL) 5 MG tablet Take 5 mg by mouth daily as needed for allergies.     meloxicam (MOBIC) 15 MG tablet Take 15 mg by mouth daily.     metoprolol succinate (TOPROL-XL) 25 MG 24 hr tablet Take 25 mg by mouth at bedtime.     pseudoephedrine (SUDAFED) 30 MG tablet Take 30 mg by mouth every 4 (four) hours as needed for congestion.     Sodium Chloride 3 % AERS Place 1 spray into the nose 4 (four) times daily as needed (sinus/ allergies).     Current Facility-Administered Medications   Medication Dose Route Frequency Provider Last Rate Last Admin   0.9 %  sodium chloride infusion  500 mL Intravenous Once Jackquline Denmark, MD        HISTORY OF PRESENT ILLNESS:   Oncology History  Breast cancer [facility])   Initial Diagnosis   Breast cancer ([place])   01/30/2017 Genetic Testing   Negative genetic testing on the STAT Breast cancer panel and the reflexed common cancer panel.  The Hereditary Gene Panel offered by [facility] includes sequencing and/or deletion duplication testing of the following 43 genes: APC, ATM, AXIN2, BARD1, BMPR1A, BRCA1, BRCA2, BRIP1, CDH1, CDKN2A (p14ARF), CDKN2A (p16INK4a), CHEK2, DICER1, EPCAM (Deletion/duplication testing only), GREM1 (promoter region deletion/duplication testing only), KIT, MEN1, MLH1, MSH2, MSH6, MUTYH, NBN, NF1, PALB2, PDGFRA, PMS2, POLD1, POLE, PTEN, RAD50, RAD51C, RAD51D, SDHB, SDHC, SDHD, SMAD4, SMARCA4. STK11, TP53, TSC1, TSC2, and VHL.  The following gene was evaluated for sequence changes only: SDHA and HOXB13 c.251G>A variant only.  The report date is January 30, 2017.        REVIEW OF SYSTEMS:   Constitutional: Denies fevers, chills or abnormal weight loss Eyes: Denies blurriness of vision Ears, nose, mouth, throat, and face: Denies mucositis or sore throat Respiratory: Denies cough, dyspnea or wheezes Cardiovascular: Denies palpitation, chest discomfort or lower extremity swelling Gastrointestinal:  Denies nausea, heartburn or change in bowel habits Skin: Denies abnormal skin rashes Lymphatics: Denies new lymphadenopathy or easy bruising Neurological:Denies numbness, tingling or new weaknesses Behavioral/Psych: Mood is stable, no new changes  All other systems were reviewed with the patient and are negative.   VITALS:  Blood pressure (!) 161/74, pulse 75, temperature 98.7 F (37.1 C), temperature source Oral, resp. rate 16, height _0  (1.549 m), weight 263 lb 3.2 oz (119.4 kg), SpO2 93 %.  Wt Readings from Last 3 Encounters:   09/30/21 263 lb 3.2 oz (119.4 kg)  09/06/21 269 lb 4 oz (122.1 kg)  08/09/21 263 lb 8 oz (119.5 kg)    Body mass index is 49.73 kg/m.  Performance status (ECOG): 1 - Symptomatic but completely ambulatory  PHYSICAL EXAM:   GENERAL:alert, no distress and comfortable SKIN: skin color, texture, turgor are normal, no rashes or significant lesions EYES: normal, Conjunctiva are pink and non-injected, sclera clear OROPHARYNX:no exudate, no erythema and lips, buccal mucosa, and tongue normal  NECK: supple, thyroid normal size, non-tender, without nodularity LYMPH:  no palpable lymphadenopathy in the cervical, axillary or inguinal LUNGS: clear to auscultation and percussion with normal breathing effort HEART: regular rate & rhythm and no murmurs and no lower extremity edema ABDOMEN:abdomen soft, non-tender and normal bowel sounds Musculoskeletal:no cyanosis of digits and no clubbing  NEURO: alert & oriented x 3 with fluent speech, no focal motor/sensory deficits  LABORATORY DATA:  I have reviewed the data as listed    Component Value Date/Time   NA 141 09/30/2021 0000   K 4.2 09/30/2021  0000   CL 104 09/30/2021 0000   CO2 28 (A) 09/30/2021 0000   GLUCOSE 117 (H) 03/20/2017 0654   BUN 11 09/30/2021 0000   CREATININE 0.6 09/30/2021 0000   CREATININE 0.62 03/20/2017 0654   CALCIUM 9.0 09/30/2021 0000   PROT 6.7 11/03/2018 1022   ALBUMIN 4.2 09/30/2021 0000   ALBUMIN 4.1 11/03/2018 1022   AST 36 (A) 09/30/2021 0000   ALT 33 09/30/2021 0000   ALKPHOS 82 09/30/2021 0000   BILITOT 0.3 11/03/2018 1022   GFRNONAA >60 03/20/2017 0654   GFRAA >60 03/20/2017 0654    No results found for: SPEP, UPEP  Lab Results  Component Value Date   WBC 9.2 09/30/2021   NEUTROABS 5.89 09/30/2021   HGB 13.2 09/30/2021   HCT 40 09/30/2021   MCV 81 03/21/2021   PLT 236 09/30/2021      Chemistry      Component Value Date/Time   NA 141 09/30/2021 0000   K 4.2 09/30/2021 0000   CL 104  09/30/2021 0000   CO2 28 (A) 09/30/2021 0000   BUN 11 09/30/2021 0000   CREATININE 0.6 09/30/2021 0000   CREATININE 0.62 03/20/2017 0654   GLU 124 09/30/2021 0000      Component Value Date/Time   CALCIUM 9.0 09/30/2021 0000   ALKPHOS 82 09/30/2021 0000   AST 36 (A) 09/30/2021 0000   ALT 33 09/30/2021 0000   BILITOT 0.3 11/03/2018 1022

## 2021-09-30 NOTE — Assessment & Plan Note (Signed)
She continues to have pain in her hands, but relates this to her diagnosis of arthritis and is managed by rheumatology, although she hasn't seen them lately.

## 2021-09-30 NOTE — Assessment & Plan Note (Signed)
She will proceed with denosumab every 6 months. We will delay this month due to dental work. She will have her next injection in December.

## 2021-09-30 NOTE — Assessment & Plan Note (Signed)
She will proceed with monthly B12 injections.

## 2021-09-30 NOTE — Assessment & Plan Note (Signed)
She continues with daily anastrozole without difficulty. She has no signs of recurrence of disease. We will see her back in 6 months for repeat evaluation.

## 2021-10-01 ENCOUNTER — Ambulatory Visit: Payer: No Typology Code available for payment source | Admitting: Hematology and Oncology

## 2021-10-01 ENCOUNTER — Other Ambulatory Visit: Payer: No Typology Code available for payment source

## 2021-10-04 ENCOUNTER — Encounter: Payer: Self-pay | Admitting: Oncology

## 2021-10-04 ENCOUNTER — Inpatient Hospital Stay: Payer: No Typology Code available for payment source

## 2021-10-04 ENCOUNTER — Other Ambulatory Visit: Payer: Self-pay

## 2021-10-04 VITALS — BP 130/71 | HR 85 | Temp 98.3°F | Resp 18 | Ht 61.0 in | Wt 264.5 lb

## 2021-10-04 DIAGNOSIS — D519 Vitamin B12 deficiency anemia, unspecified: Secondary | ICD-10-CM | POA: Diagnosis present

## 2021-10-04 MED ORDER — CYANOCOBALAMIN 1000 MCG/ML IJ SOLN
1000.0000 ug | Freq: Once | INTRAMUSCULAR | Status: AC
  Administered 2021-10-04: 1000 ug via INTRAMUSCULAR
  Filled 2021-10-04: qty 1

## 2021-10-04 NOTE — Progress Notes (Signed)
1155:PT STABLE AT TIME OF DISCHARGE

## 2021-10-04 NOTE — Patient Instructions (Signed)

## 2021-10-07 ENCOUNTER — Encounter: Payer: Self-pay | Admitting: Oncology

## 2021-11-01 ENCOUNTER — Inpatient Hospital Stay: Payer: PRIVATE HEALTH INSURANCE | Attending: Oncology

## 2021-11-01 ENCOUNTER — Other Ambulatory Visit: Payer: Self-pay

## 2021-11-01 ENCOUNTER — Encounter: Payer: Self-pay | Admitting: Oncology

## 2021-11-01 VITALS — BP 114/69 | HR 73 | Temp 98.4°F | Resp 18 | Ht 61.0 in | Wt 255.8 lb

## 2021-11-01 DIAGNOSIS — M8580 Other specified disorders of bone density and structure, unspecified site: Secondary | ICD-10-CM | POA: Insufficient documentation

## 2021-11-01 DIAGNOSIS — D519 Vitamin B12 deficiency anemia, unspecified: Secondary | ICD-10-CM | POA: Diagnosis not present

## 2021-11-01 MED ORDER — CYANOCOBALAMIN 1000 MCG/ML IJ SOLN
1000.0000 ug | Freq: Once | INTRAMUSCULAR | Status: AC
  Administered 2021-11-01: 1000 ug via INTRAMUSCULAR
  Filled 2021-11-01: qty 1

## 2021-11-01 MED ORDER — DENOSUMAB 60 MG/ML ~~LOC~~ SOSY
60.0000 mg | PREFILLED_SYRINGE | Freq: Once | SUBCUTANEOUS | Status: AC
  Administered 2021-11-01: 60 mg via SUBCUTANEOUS
  Filled 2021-11-01: qty 1

## 2021-11-01 NOTE — Patient Instructions (Addendum)
Vitamin B12 Injection What is this medication? Vitamin B12 (VAHY tuh min B12) prevents and treats low vitamin B12 levels in your body. It is used in people who do not get enough vitamin B12 from their diet or when their digestive tract does not absorb enough. Vitamin B12 plays an important role in maintaining the health of your nervous system and red blood cells. This medicine may be used for other purposes; ask your health care provider or pharmacist if you have questions. COMMON BRAND NAME(S): B-12 Compliance Kit, B-12 Injection Kit, Cyomin, Dodex, LA-12, Nutri-Twelve, Physicians EZ Use B-12, Primabalt What should I tell my care team before I take this medication? They need to know if you have any of these conditions: Kidney disease Leber's disease Megaloblastic anemia An unusual or allergic reaction to cyanocobalamin, cobalt, other medications, foods, dyes, or preservatives Pregnant or trying to get pregnant Breast-feeding How should I use this medication? This medication is injected into a muscle or deeply under the skin. It is usually given in a clinic or care team's office. However, your care team may teach you how to inject yourself. Follow all instructions. Talk to your care team about the use of this medication in children. Special care may be needed. Overdosage: If you think you have taken too much of this medicine contact a poison control center or emergency room at once. NOTE: This medicine is only for you. Do not share this medicine with others. What if I miss a dose? If you are given your dose at a clinic or care team's office, call to reschedule your appointment. If you give your own injections, and you miss a dose, take it as soon as you can. If it is almost time for your next dose, take only that dose. Do not take double or extra doses. What may interact with this medication? Colchicine Heavy alcohol intake This list may not describe all possible interactions. Give your health  care provider a list of all the medicines, herbs, non-prescription drugs, or dietary supplements you use. Also tell them if you smoke, drink alcohol, or use illegal drugs. Some items may interact with your medicine. What should I watch for while using this medication? Visit your care team regularly. You may need blood work done while you are taking this medication. You may need to follow a special diet. Talk to your care team. Limit your alcohol intake and avoid smoking to get the best benefit. What side effects may I notice from receiving this medication? Side effects that you should report to your care team as soon as possible: Allergic reactions--skin rash, itching, hives, swelling of the face, lips, tongue, or throat Swelling of the ankles, hands, or feet Trouble breathing Side effects that usually do not require medical attention (report to your care team if they continue or are bothersome): Diarrhea This list may not describe all possible side effects. Call your doctor for medical advice about side effects. You may report side effects to FDA at 1-800-FDA-1088. Where should I keep my medication? Keep out of the reach of children. Store at room temperature between 15 and 30 degrees C (59 and 85 degrees F). Protect from light. Throw away any unused medication after the expiration date. NOTE: This sheet is a summary. It may not cover all possible information. If you have questions about this medicine, talk to your doctor, pharmacist, or health care provider.  2022 Elsevier/Gold Standard (2021-01-23 00:00:00) Denosumab injection What is this medication? DENOSUMAB (den oh sue mab) slows   bone breakdown. Prolia is used to treat osteoporosis in women after menopause and in men, and in people who are taking corticosteroids for 6 months or more. Delton See is used to treat a high calcium level due to cancer and to prevent bone fractures and other bone problems caused by multiple myeloma or cancer bone  metastases. Delton See is also used to treat giant cell tumor of the bone. This medicine may be used for other purposes; ask your health care provider or pharmacist if you have questions. COMMON BRAND NAME(S): Prolia, XGEVA What should I tell my care team before I take this medication? They need to know if you have any of these conditions: dental disease having surgery or tooth extraction infection kidney disease low levels of calcium or Vitamin D in the blood malnutrition on hemodialysis skin conditions or sensitivity thyroid or parathyroid disease an unusual reaction to denosumab, other medicines, foods, dyes, or preservatives pregnant or trying to get pregnant breast-feeding How should I use this medication? This medicine is for injection under the skin. It is given by a health care professional in a hospital or clinic setting. A special MedGuide will be given to you before each treatment. Be sure to read this information carefully each time. For Prolia, talk to your pediatrician regarding the use of this medicine in children. Special care may be needed. For Delton See, talk to your pediatrician regarding the use of this medicine in children. While this drug may be prescribed for children as young as 13 years for selected conditions, precautions do apply. Overdosage: If you think you have taken too much of this medicine contact a poison control center or emergency room at once. NOTE: This medicine is only for you. Do not share this medicine with others. What if I miss a dose? It is important not to miss your dose. Call your doctor or health care professional if you are unable to keep an appointment. What may interact with this medication? Do not take this medicine with any of the following medications: other medicines containing denosumab This medicine may also interact with the following medications: medicines that lower your chance of fighting infection steroid medicines like prednisone or  cortisone This list may not describe all possible interactions. Give your health care provider a list of all the medicines, herbs, non-prescription drugs, or dietary supplements you use. Also tell them if you smoke, drink alcohol, or use illegal drugs. Some items may interact with your medicine. What should I watch for while using this medication? Visit your doctor or health care professional for regular checks on your progress. Your doctor or health care professional may order blood tests and other tests to see how you are doing. Call your doctor or health care professional for advice if you get a fever, chills or sore throat, or other symptoms of a cold or flu. Do not treat yourself. This drug may decrease your body's ability to fight infection. Try to avoid being around people who are sick. You should make sure you get enough calcium and vitamin D while you are taking this medicine, unless your doctor tells you not to. Discuss the foods you eat and the vitamins you take with your health care professional. See your dentist regularly. Brush and floss your teeth as directed. Before you have any dental work done, tell your dentist you are receiving this medicine. Do not become pregnant while taking this medicine or for 5 months after stopping it. Talk with your doctor or health care professional about  your birth control options while taking this medicine. Women should inform their doctor if they wish to become pregnant or think they might be pregnant. There is a potential for serious side effects to an unborn child. Talk to your health care professional or pharmacist for more information. What side effects may I notice from receiving this medication? Side effects that you should report to your doctor or health care professional as soon as possible: allergic reactions like skin rash, itching or hives, swelling of the face, lips, or tongue bone pain breathing problems dizziness jaw pain, especially after  dental work redness, blistering, peeling of the skin signs and symptoms of infection like fever or chills; cough; sore throat; pain or trouble passing urine signs of low calcium like fast heartbeat, muscle cramps or muscle pain; pain, tingling, numbness in the hands or feet; seizures unusual bleeding or bruising unusually weak or tired Side effects that usually do not require medical attention (report to your doctor or health care professional if they continue or are bothersome): constipation diarrhea headache joint pain loss of appetite muscle pain runny nose tiredness upset stomach This list may not describe all possible side effects. Call your doctor for medical advice about side effects. You may report side effects to FDA at 1-800-FDA-1088. Where should I keep my medication? This medicine is only given in a clinic, doctor's office, or other health care setting and will not be stored at home. NOTE: This sheet is a summary. It may not cover all possible information. If you have questions about this medicine, talk to your doctor, pharmacist, or health care provider.  2022 Elsevier/Gold Standard (2018-03-19 00:00:00)

## 2021-11-05 ENCOUNTER — Other Ambulatory Visit: Payer: Self-pay | Admitting: Gastroenterology

## 2021-11-25 ENCOUNTER — Encounter: Payer: Self-pay | Admitting: Oncology

## 2021-11-26 ENCOUNTER — Encounter: Payer: Self-pay | Admitting: Oncology

## 2021-12-02 ENCOUNTER — Other Ambulatory Visit: Payer: Self-pay

## 2021-12-02 ENCOUNTER — Inpatient Hospital Stay: Payer: PRIVATE HEALTH INSURANCE | Attending: Oncology

## 2021-12-02 ENCOUNTER — Other Ambulatory Visit: Payer: Self-pay | Admitting: Pharmacist

## 2021-12-02 VITALS — BP 138/75 | HR 95 | Temp 98.2°F | Resp 18 | Ht 61.0 in | Wt 249.0 lb

## 2021-12-02 DIAGNOSIS — D519 Vitamin B12 deficiency anemia, unspecified: Secondary | ICD-10-CM | POA: Diagnosis present

## 2021-12-02 MED ORDER — CYANOCOBALAMIN 1000 MCG/ML IJ SOLN
1000.0000 ug | Freq: Once | INTRAMUSCULAR | Status: AC
  Administered 2021-12-02: 1000 ug via INTRAMUSCULAR
  Filled 2021-12-02: qty 1

## 2021-12-02 NOTE — Patient Instructions (Signed)
Vitamin B12 Deficiency ?Vitamin B12 deficiency occurs when the body does not have enough vitamin B12, which is an important vitamin. The body needs this vitamin: ?To make red blood cells. ?To make DNA. This is the genetic material inside cells. ?To help the nerves work properly so they can carry messages from the brain to the body. ?Vitamin B12 deficiency can cause various health problems, such as a low red blood cell count (anemia) or nerve damage. ?What are the causes? ?This condition may be caused by: ?Not eating enough foods that contain vitamin B12. ?Not having enough stomach acid and digestive fluids to properly absorb vitamin B12 from the food that you eat. ?Certain digestive system diseases that make it hard to absorb vitamin B12. These diseases include Crohn's disease, chronic pancreatitis, and cystic fibrosis. ?A condition in which the body does not make enough of a protein (intrinsic factor), resulting in too few red blood cells (pernicious anemia). ?Having a surgery in which part of the stomach or small intestine is removed. ?Taking certain medicines that make it hard for the body to absorb vitamin B12. These medicines include: ?Heartburn medicines (antacids and proton pump inhibitors). ?Certain antibiotic medicines. ?Some medicines that are used to treat diabetes, tuberculosis, gout, or high cholesterol. ?What increases the risk? ?The following factors may make you more likely to develop a B12 deficiency: ?Being older than age 50. ?Eating a vegetarian or vegan diet, especially while you are pregnant. ?Eating a poor diet while you are pregnant. ?Taking certain medicines. ?Having alcoholism. ?What are the signs or symptoms? ?In some cases, there are no symptoms of this condition. If the condition leads to anemia or nerve damage, various symptoms can occur, such as: ?Weakness. ?Fatigue. ?Loss of appetite. ?Weight loss. ?Numbness or tingling in your hands and feet. ?Redness and burning of the  tongue. ?Confusion or memory problems. ?Depression. ?Sensory problems, such as color blindness, ringing in the ears, or loss of taste. ?Diarrhea or constipation. ?Trouble walking. ?If anemia is severe, symptoms can include: ?Shortness of breath. ?Dizziness. ?Rapid heart rate (tachycardia). ?How is this diagnosed? ?This condition may be diagnosed with a blood test to measure the level of vitamin B12 in your blood. You may also have other tests, including: ?A group of tests that measure certain characteristics of blood cells (complete blood count, CBC). ?A blood test to measure intrinsic factor. ?A procedure where a thin tube with a camera on the end is used to look into your stomach or intestines (endoscopy). ?Other tests may be needed to discover the cause of B12 deficiency. ?How is this treated? ?Treatment for this condition depends on the cause. This condition may be treated by: ?Changing your eating and drinking habits, such as: ?Eating more foods that contain vitamin B12. ?Drinking less alcohol or no alcohol. ?Getting vitamin B12 injections. ?Taking vitamin B12 supplements. Your health care provider will tell you which dosage is best for you. ?Follow these instructions at home: ?Eating and drinking ? ?Eat lots of healthy foods that contain vitamin B12, including: ?Meats and poultry. This includes beef, pork, chicken, turkey, and organ meats, such as liver. ?Seafood. This includes clams, rainbow trout, salmon, tuna, and haddock. ?Eggs. ?Cereal and dairy products that are fortified. This means that vitamin B12 has been added to the food. Check the label on the package to see if the food is fortified. ?The items listed above may not be a complete list of recommended foods and beverages. Contact a dietitian for more information. ?General instructions ?Get any   injections that are prescribed by your health care provider. ?Take supplements only as told by your health care provider. Follow the directions carefully. ?Do  not drink alcohol if your health care provider tells you not to. In some cases, you may only be asked to limit alcohol use. ?Keep all follow-up visits as told by your health care provider. This is important. ?Contact a health care provider if: ?Your symptoms come back. ?Get help right away if you: ?Develop shortness of breath. ?Have a rapid heart rate. ?Have chest pain. ?Become dizzy or lose consciousness. ?Summary ?Vitamin B12 deficiency occurs when the body does not have enough vitamin B12. ?The main causes of vitamin B12 deficiency include dietary deficiency, digestive diseases, pernicious anemia, and having a surgery in which part of the stomach or small intestine is removed. ?In some cases, there are no symptoms of this condition. If the condition leads to anemia or nerve damage, various symptoms can occur, such as weakness, shortness of breath, and numbness. ?Treatment may include getting vitamin B12 injections or taking vitamin B12 supplements. Eat lots of healthy foods that contain vitamin B12. ?This information is not intended to replace advice given to you by your health care provider. Make sure you discuss any questions you have with your health care provider. ?Document Revised: 05/08/2021 Document Reviewed: 07/20/2018 ?Elsevier Patient Education ? 2022 Elsevier Inc. ? ?

## 2021-12-26 ENCOUNTER — Encounter: Payer: Self-pay | Admitting: Oncology

## 2022-01-02 ENCOUNTER — Ambulatory Visit: Payer: No Typology Code available for payment source

## 2022-01-03 ENCOUNTER — Other Ambulatory Visit: Payer: Self-pay

## 2022-01-03 ENCOUNTER — Inpatient Hospital Stay: Payer: PRIVATE HEALTH INSURANCE | Attending: Oncology

## 2022-01-03 VITALS — BP 111/60 | HR 80 | Temp 98.1°F | Resp 18 | Ht 61.0 in | Wt 242.0 lb

## 2022-01-03 DIAGNOSIS — D519 Vitamin B12 deficiency anemia, unspecified: Secondary | ICD-10-CM | POA: Insufficient documentation

## 2022-01-03 MED ORDER — CYANOCOBALAMIN 1000 MCG/ML IJ SOLN
1000.0000 ug | Freq: Once | INTRAMUSCULAR | Status: AC
  Administered 2022-01-03: 1000 ug via INTRAMUSCULAR
  Filled 2022-01-03: qty 1

## 2022-01-03 NOTE — Patient Instructions (Signed)
Vitamin B12 Deficiency Vitamin B12 deficiency means that your body does not have enough vitamin B12. The body needs this important vitamin: To make red blood cells. To make genes (DNA). To help the nerves work. If you do not have enough vitamin B12 in your body, you can have health problems, such as not having enough red blood cells in the blood (anemia). What are the causes? Not eating enough foods that contain vitamin B12. Not being able to take in (absorb) vitamin B12 from the food that you eat. Certain diseases. A condition in which the body does not make enough of a certain protein. This results in your body not taking in enough vitamin B12. Having a surgery in which part of the stomach or small intestine is taken out. Taking medicines that make it hard for the body to take in vitamin B12. These include: Heartburn medicines. Some medicines that are used to treat diabetes. What increases the risk? Being an older adult. Eating a vegetarian or vegan diet that does not include any foods that come from animals. Not eating enough foods that contain vitamin B12 while you are pregnant. Taking certain medicines. Having alcoholism. What are the signs or symptoms? In some cases, there are no symptoms. If the condition leads to too few blood cells or nerve damage, symptoms can occur, such as: Feeling weak or tired. Not being hungry. Losing feeling (numbness) or tingling in your hands and feet. Redness and burning of the tongue. Feeling sad (depressed). Confusion or memory problems. Trouble walking. If anemia is very bad, symptoms can include: Being short of breath. Being dizzy. Having a very fast heartbeat. How is this treated? Changing the way you eat and drink, such as: Eating more foods that contain vitamin B12. Drinking little or no alcohol. Getting vitamin B12 shots. Taking vitamin B12 supplements by mouth (orally). Your doctor will tell you the dose that is best for you. Follow  these instructions at home: Eating and drinking  Eat foods that come from animals and have a lot of vitamin B12 in them. These include: Meats and poultry. This includes beef, pork, chicken, turkey, and organ meats, such as liver. Seafood, such as clams, rainbow trout, salmon, tuna, and haddock. Eggs. Dairy foods such as milk, yogurt, and cheese. Eat breakfast cereals that have vitamin B12 added to them (are fortified). Check the label. The items listed above may not be a complete list of foods and beverages you can eat and drink. Contact a dietitian for more information. Alcohol use Do not drink alcohol if: Your doctor tells you not to drink. You are pregnant, may be pregnant, or are planning to become pregnant. If you drink alcohol: Limit how much you have to: 0-1 drink a day for women. 0-2 drinks a day for men. Know how much alcohol is in your drink. In the [place] one drink equals one 12 oz bottle of beer (355 mL), one 5 oz glass of wine (148 mL), or one 1 oz glass of hard liquor (44 mL). General instructions Get any vitamin B12 shots if told by your doctor. Take supplements only as told by your doctor. Follow the directions. Keep all follow-up visits. Contact a doctor if: Your symptoms come back. Your symptoms get worse or do not get better with treatment. Get help right away if: You have trouble breathing. You have a very fast heartbeat. You have chest pain. You get dizzy. You faint. These symptoms may be an emergency. Get help right away. Call 911.   Do not wait to see if the symptoms will go away. Do not drive yourself to the hospital. Summary Vitamin B12 deficiency means that your body is not getting enough of the vitamin. In some cases, there are no symptoms of this condition. Treatment may include making a change in the way you eat and drink, getting shots, or taking supplements. Eat foods that have vitamin B12 in them. This information is not intended to replace advice  given to you by your health care provider. Make sure you discuss any questions you have with your health care provider. Document Revised: 07/05/2021 Document Reviewed: 07/05/2021 Elsevier Patient Education  2022 Elsevier Inc.  

## 2022-01-06 ENCOUNTER — Other Ambulatory Visit: Payer: Self-pay | Admitting: Hematology and Oncology

## 2022-01-06 DIAGNOSIS — C50812 Malignant neoplasm of overlapping sites of left female breast: Secondary | ICD-10-CM

## 2022-01-06 DIAGNOSIS — Z170 Estrogen receptor positive status [ER+]: Secondary | ICD-10-CM

## 2022-01-08 ENCOUNTER — Encounter: Payer: Self-pay | Admitting: Oncology

## 2022-01-08 NOTE — Telephone Encounter (Signed)
She will finish hormonal therapy in Sept 2023

## 2022-01-13 ENCOUNTER — Other Ambulatory Visit: Payer: Self-pay

## 2022-01-13 ENCOUNTER — Ambulatory Visit: Payer: PRIVATE HEALTH INSURANCE | Admitting: Cardiology

## 2022-01-13 ENCOUNTER — Encounter: Payer: Self-pay | Admitting: Cardiology

## 2022-01-13 VITALS — BP 130/80 | HR 70 | Ht 61.0 in | Wt 240.0 lb

## 2022-01-13 DIAGNOSIS — E785 Hyperlipidemia, unspecified: Secondary | ICD-10-CM

## 2022-01-13 DIAGNOSIS — E119 Type 2 diabetes mellitus without complications: Secondary | ICD-10-CM

## 2022-01-13 DIAGNOSIS — Z170 Estrogen receptor positive status [ER+]: Secondary | ICD-10-CM

## 2022-01-13 DIAGNOSIS — R9439 Abnormal result of other cardiovascular function study: Secondary | ICD-10-CM

## 2022-01-13 DIAGNOSIS — C50812 Malignant neoplasm of overlapping sites of left female breast: Secondary | ICD-10-CM | POA: Diagnosis not present

## 2022-01-13 NOTE — Patient Instructions (Signed)
Medication Instructions:  Your physician recommends that you continue on your current medications as directed. Please refer to the Current Medication list given to you today.  *If you need a refill on your cardiac medications before your next appointment, please call your pharmacy*   Lab Work: Your physician recommends that you return for lab work in:   Labs: Lipids  If you have labs (blood work) drawn today and your tests are completely normal, you will receive your results only by: MyChart Message (if you have MyChart) OR A paper copy in the mail If you have any lab test that is abnormal or we need to change your treatment, we will call you to review the results.   Testing/Procedures: None   Follow-Up: At [facility], you and your health needs are our priority.  As part of our continuing mission to provide you with exceptional heart care, we have created designated Provider Care Teams.  These Care Teams include your primary Cardiologist (physician) and Advanced Practice Providers (APPs -  Physician Assistants and Nurse Practitioners) who all work together to provide you with the care you need, when you need it.  We recommend signing up for the patient portal called "MyChart".  Sign up information is provided on this After Visit Summary.  MyChart is used to connect with patients for Virtual Visits (Telemedicine).  Patients are able to view lab/test results, encounter notes, upcoming appointments, etc.  Non-urgent messages can be sent to your provider as well.   To learn more about what you can do with MyChart, go to [email or web address].    Your next appointment:   6 month(s)  The format for your next appointment:   In Person  Provider:   Jenne Campus, MD    Other Instructions None
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
## 2022-01-13 NOTE — Addendum Note (Signed)
Addended by: Edwyna Shell I on: 01/13/2022 04:30 PM   Modules accepted: Orders

## 2022-01-17 LAB — LIPID PANEL
Chol/HDL Ratio: 3.3 ratio (ref 0.0–4.4)
Cholesterol, Total: 134 mg/dL (ref 100–199)
HDL: 41 mg/dL (ref >39)
LDL Chol Calc (NIH): 77 mg/dL (ref 0–99)
Triglycerides: 85 mg/dL (ref 0–149)
VLDL Cholesterol Cal: 16 mg/dL (ref 5–40)

## 2022-01-30 ENCOUNTER — Ambulatory Visit: Payer: No Typology Code available for payment source

## 2022-01-31 ENCOUNTER — Inpatient Hospital Stay: Payer: PRIVATE HEALTH INSURANCE | Attending: Oncology

## 2022-01-31 VITALS — BP 128/55 | HR 76 | Temp 98.2°F | Resp 18 | Ht 61.0 in | Wt 236.1 lb

## 2022-01-31 DIAGNOSIS — D519 Vitamin B12 deficiency anemia, unspecified: Secondary | ICD-10-CM | POA: Insufficient documentation

## 2022-01-31 MED ORDER — CYANOCOBALAMIN 1000 MCG/ML IJ SOLN
1000.0000 ug | Freq: Once | INTRAMUSCULAR | Status: AC
  Administered 2022-01-31: 1000 ug via INTRAMUSCULAR
  Filled 2022-01-31: qty 1

## 2022-01-31 NOTE — Patient Instructions (Signed)
Vitamin B12 Injection ?What is this medication? ?Vitamin B12 (VAHY tuh min B12) prevents and treats low vitamin B12 levels in your body. It is used in people who do not get enough vitamin B12 from their diet or when their digestive tract does not absorb enough. Vitamin B12 plays an important role in maintaining the health of your nervous system and red blood cells. ?This medicine may be used for other purposes; ask your health care provider or pharmacist if you have questions. ?COMMON BRAND NAME(S): B-12 Compliance Kit, B-12 Injection Kit, Cyomin, Dodex, LA-12, Nutri-Twelve, Physicians EZ Use B-12, Primabalt ?What should I tell my care team before I take this medication? ?They need to know if you have any of these conditions: ?Kidney disease ?Leber's disease ?Megaloblastic anemia ?An unusual or allergic reaction to cyanocobalamin, cobalt, other medications, foods, dyes, or preservatives ?Pregnant or trying to get pregnant ?Breast-feeding ?How should I use this medication? ?This medication is injected into a muscle or deeply under the skin. It is usually given in a clinic or care team's office. However, your care team may teach you how to inject yourself. Follow all instructions. ?Talk to your care team about the use of this medication in children. Special care may be needed. ?Overdosage: If you think you have taken too much of this medicine contact a poison control center or emergency room at once. ?NOTE: This medicine is only for you. Do not share this medicine with others. ?What if I miss a dose? ?If you are given your dose at a clinic or care team's office, call to reschedule your appointment. If you give your own injections, and you miss a dose, take it as soon as you can. If it is almost time for your next dose, take only that dose. Do not take double or extra doses. ?What may interact with this medication? ?Colchicine ?Heavy alcohol intake ?This list may not describe all possible interactions. Give your health  care provider a list of all the medicines, herbs, non-prescription drugs, or dietary supplements you use. Also tell them if you smoke, drink alcohol, or use illegal drugs. Some items may interact with your medicine. ?What should I watch for while using this medication? ?Visit your care team regularly. You may need blood work done while you are taking this medication. ?You may need to follow a special diet. Talk to your care team. Limit your alcohol intake and avoid smoking to get the best benefit. ?What side effects may I notice from receiving this medication? ?Side effects that you should report to your care team as soon as possible: ?Allergic reactions--skin rash, itching, hives, swelling of the face, lips, tongue, or throat ?Swelling of the ankles, hands, or feet ?Trouble breathing ?Side effects that usually do not require medical attention (report to your care team if they continue or are bothersome): ?Diarrhea ?This list may not describe all possible side effects. Call your doctor for medical advice about side effects. You may report side effects to FDA at 1-800-FDA-1088. ?Where should I keep my medication? ?Keep out of the reach of children. ?Store at room temperature between 15 and 30 degrees C (59 and 85 degrees F). Protect from light. Throw away any unused medication after the expiration date. ?NOTE: This sheet is a summary. It may not cover all possible information. If you have questions about this medicine, talk to your doctor, pharmacist, or health care provider. ?? 2022 Elsevier/Gold Standard (2021-01-23 00:00:00) ? ?

## 2022-02-20 ENCOUNTER — Encounter: Payer: Self-pay | Admitting: Oncology

## 2022-02-20 ENCOUNTER — Ambulatory Visit
Admission: RE | Admit: 2022-02-20 | Discharge: 2022-02-20 | Disposition: A | Discharge status: Home / Self Care | Payer: PRIVATE HEALTH INSURANCE | Source: Ambulatory Visit | Attending: Hematology and Oncology | Admitting: Hematology and Oncology

## 2022-02-20 DIAGNOSIS — Z170 Estrogen receptor positive status [ER+]: Secondary | ICD-10-CM

## 2022-02-28 ENCOUNTER — Ambulatory Visit: Payer: PRIVATE HEALTH INSURANCE

## 2022-03-03 ENCOUNTER — Ambulatory Visit: Payer: No Typology Code available for payment source

## 2022-03-06 ENCOUNTER — Other Ambulatory Visit: Payer: Self-pay | Admitting: Pharmacist

## 2022-03-07 ENCOUNTER — Ambulatory Visit: Payer: PRIVATE HEALTH INSURANCE

## 2022-03-13 ENCOUNTER — Encounter: Payer: Self-pay | Admitting: Oncology

## 2022-03-14 ENCOUNTER — Inpatient Hospital Stay: Payer: PRIVATE HEALTH INSURANCE | Attending: Oncology

## 2022-03-14 VITALS — BP 128/68 | HR 65 | Temp 98.3°F | Resp 18 | Ht 61.0 in | Wt 228.2 lb

## 2022-03-14 DIAGNOSIS — D519 Vitamin B12 deficiency anemia, unspecified: Secondary | ICD-10-CM | POA: Insufficient documentation

## 2022-03-14 MED ORDER — CYANOCOBALAMIN 1000 MCG/ML IJ SOLN
1000.0000 ug | Freq: Once | INTRAMUSCULAR | Status: AC
  Administered 2022-03-14: 1000 ug via INTRAMUSCULAR
  Filled 2022-03-14: qty 1

## 2022-03-14 NOTE — Patient Instructions (Signed)
Vitamin B12 Injection ?What is this medication? ?Vitamin B12 (VAHY tuh min B12) prevents and treats low vitamin B12 levels in your body. It is used in people who do not get enough vitamin B12 from their diet or when their digestive tract does not absorb enough. Vitamin B12 plays an important role in maintaining the health of your nervous system and red blood cells. ?This medicine may be used for other purposes; ask your health care provider or pharmacist if you have questions. ?COMMON BRAND NAME(S): B-12 Compliance Kit, B-12 Injection Kit, Cyomin, Dodex, LA-12, Nutri-Twelve, Physicians EZ Use B-12, Primabalt ?What should I tell my care team before I take this medication? ?They need to know if you have any of these conditions: ?Kidney disease ?Leber's disease ?Megaloblastic anemia ?An unusual or allergic reaction to cyanocobalamin, cobalt, other medications, foods, dyes, or preservatives ?Pregnant or trying to get pregnant ?Breast-feeding ?How should I use this medication? ?This medication is injected into a muscle or deeply under the skin. It is usually given in a clinic or care team's office. However, your care team may teach you how to inject yourself. Follow all instructions. ?Talk to your care team about the use of this medication in children. Special care may be needed. ?Overdosage: If you think you have taken too much of this medicine contact a poison control center or emergency room at once. ?NOTE: This medicine is only for you. Do not share this medicine with others. ?What if I miss a dose? ?If you are given your dose at a clinic or care team's office, call to reschedule your appointment. If you give your own injections, and you miss a dose, take it as soon as you can. If it is almost time for your next dose, take only that dose. Do not take double or extra doses. ?What may interact with this medication? ?Alcohol ?Colchicine ?This list may not describe all possible interactions. Give your health care  provider a list of all the medicines, herbs, non-prescription drugs, or dietary supplements you use. Also tell them if you smoke, drink alcohol, or use illegal drugs. Some items may interact with your medicine. ?What should I watch for while using this medication? ?Visit your care team regularly. You may need blood work done while you are taking this medication. ?You may need to follow a special diet. Talk to your care team. Limit your alcohol intake and avoid smoking to get the best benefit. ?What side effects may I notice from receiving this medication? ?Side effects that you should report to your care team as soon as possible: ?Allergic reactions--skin rash, itching, hives, swelling of the face, lips, tongue, or throat ?Swelling of the ankles, hands, or feet ?Trouble breathing ?Side effects that usually do not require medical attention (report to your care team if they continue or are bothersome): ?Diarrhea ?This list may not describe all possible side effects. Call your doctor for medical advice about side effects. You may report side effects to FDA at 1-800-FDA-1088. ?Where should I keep my medication? ?Keep out of the reach of children. ?Store at room temperature between 15 and 30 degrees C (59 and 85 degrees F). Protect from light. Throw away any unused medication after the expiration date. ?NOTE: This sheet is a summary. It may not cover all possible information. If you have questions about this medicine, talk to your doctor, pharmacist, or health care provider. ?? 2023 Elsevier/Gold Standard (2021-07-23 00:00:00) ? ?

## 2022-03-14 NOTE — Progress Notes (Signed)
1132:PT STABLE AT TIME OF DISCHARGE ?

## 2022-03-26 ENCOUNTER — Other Ambulatory Visit: Payer: Self-pay | Admitting: Oncology

## 2022-03-26 DIAGNOSIS — C50919 Malignant neoplasm of unspecified site of unspecified female breast: Secondary | ICD-10-CM

## 2022-03-26 NOTE — Progress Notes (Signed)
Patient Care Team: Myrlene Broker, MD as PCP - General (Family Medicine) Park Liter, MD as PCP - Cardiology (Cardiology) Derwood Kaplan, MD as PCP - Hematology/Oncology (Oncology) Rolm Bookbinder, MD as Consulting Physician (General Surgery) Gatha Mayer, MD as Consulting Physician (Radiation Oncology)  Clinic Day:  03/27/22  Referring physician: Myrlene Broker, MD  ASSESSMENT & PLAN:   Breast cancer [facility]) She continues with daily anastrozole without difficulty. She has no signs of recurrence of disease.  She is due to complete her 5 years of anastrozole in September and can stop it at that time.  Her Oncotype current score was low and so I do not think she needs extended adjuvant hormonal therapy.  We will see her back in 6 months for repeat evaluation.  Her last mammogram on February 20, 2022 was clear.   Osteopenia after menopause She will proceed with denosumab every 6 months until her next bone density scan, which will be due in 1 year.  Her last 1 was on Apr 01, 2021.  We delayed last time due to dental work, and so she will be due at the end of May. She will have her next injection in November.    Vitamin B12 deficiency anemia, unspecified She will stop the monthly B12 injections and I will recheck a level in 6 months .   I will schedule her for her Prolia injection on May 26.  We will then stop her B12 and recheck a level next time to see if she needs to get back on injections.  I will see her back in 6 months with CBC, comprehensive metabolic profile, and E93 level.  She will then be due for her next Prolia injection in mid November.  I have instructed her to stop her anastrozole in September as she will have completed 5 years.  The patient understands the plans discussed today and is in agreement with them.  She knows to contact our office if she develops concerns prior to her next appointment.  The patient understands the plans discussed today and is in  agreement with them.  She knows to contact our office if she develops concerns prior to her next appointment.    Derwood Kaplan, MD  [facility] [address] [name] [address] Dept: [phone] Dept Fax: [phone]      CHIEF COMPLAINT:  CC: A 56 year old female with history of breast cancer here for 6 month evaluation.  Current Treatment: Anastrozole/ denosumab every 6 months.  INTERVAL HISTORY:  Anne Lawrence is here today for repeat clinical assessment.  She is doing well and has been trying to lose weight successfully.  She has gained a little weight recently but overall tells me she lost 30 pounds.  Her annual screening mammogram on February 20, 2022 was clear.  She started her anastrozole in September 2018, so she will be due to finish that this September to complete 5 years.  Her Oncotype recurrence score was low and so she does not need extended adjuvant hormonal therapy.  She will be due for another bone density scan in 1 year and then we should be able to stop her Prolia at that time.  She asks how long she needs to be on B12 injections and I told her we could stop it and recheck a level in 6 months.  Her genetic testing was negative.  She denies fevers or chills. She denies pain. Her appetite  is good.  She no longer follows with the surgeon so we are scheduling her annual mammograms.  I have reviewed the past medical history, past surgical history, social history and family history with the patient and they are unchanged from previous note.  ALLERGIES:  is allergic to sulfamethoxazole-trimethoprim.  MEDICATIONS:  Current Outpatient Medications  Medication Sig Dispense Refill   acetaminophen (TYLENOL) 500 MG tablet Take 500 mg by mouth every 6 (six) hours as needed for mild pain or moderate pain.     anastrozole (ARIMIDEX) 1 MG tablet TAKE ONE TABLET BY MOUTH DAILY 30 tablet 5   Ascorbic Acid (VITAMIN C) 100  MG tablet Take 100 mg by mouth daily.     Carboxymethylcellul-Glycerin (LUBRICATING EYE DROPS OP) Apply 1 drop to eye daily as needed (dry eyes). Unknown strength     cholecalciferol (VITAMIN D3) 25 MCG (1000 UNIT) tablet Take 1,000 Units by mouth daily.     cholestyramine light (PREVALITE) 4 GM/DOSE powder Take 1 g by mouth daily.     Cyanocobalamin (B-12 COMPLIANCE INJECTION IJ) Inject 100 mg as directed every 30 (thirty) days.     esomeprazole (NEXIUM) 40 MG capsule Take 40 mg by mouth daily as needed (heart burn).     fluticasone (FLONASE) 50 MCG/ACT nasal spray Place 2 sprays into both nostrils daily.     ibuprofen (ADVIL,MOTRIN) 200 MG tablet Take 200 mg by mouth every 6 (six) hours as needed for mild pain, moderate pain or cramping (takes 3 tablets).     levocetirizine (XYZAL) 5 MG tablet Take 5 mg by mouth daily as needed for allergies.     meloxicam (MOBIC) 15 MG tablet Take 15 mg by mouth daily.     metoprolol succinate (TOPROL-XL) 25 MG 24 hr tablet Take 25 mg by mouth at bedtime.     pseudoephedrine (SUDAFED) 30 MG tablet Take 30 mg by mouth every 4 (four) hours as needed for congestion.     Sodium Chloride 3 % AERS Place 1 spray into the nose 4 (four) times daily as needed (sinus/ allergies).     vitamin A 3 MG (10000 UNITS) capsule Take by mouth.     Current Facility-Administered Medications  Medication Dose Route Frequency Provider Last Rate Last Admin   0.9 %  sodium chloride infusion  500 mL Intravenous Once Jackquline Denmark, MD        HISTORY OF PRESENT ILLNESS:   Oncology History  Breast cancer [facility])   Initial Diagnosis   Breast cancer ([place])    01/30/2017 Genetic Testing   Negative genetic testing on the STAT Breast cancer panel and the reflexed common cancer panel.  The Hereditary Gene Panel offered by [facility] includes sequencing and/or deletion duplication testing of the following 43 genes: APC, ATM, AXIN2, BARD1, BMPR1A, BRCA1, BRCA2, BRIP1, CDH1, CDKN2A (p14ARF), CDKN2A  (p16INK4a), CHEK2, DICER1, EPCAM (Deletion/duplication testing only), GREM1 (promoter region deletion/duplication testing only), KIT, MEN1, MLH1, MSH2, MSH6, MUTYH, NBN, NF1, PALB2, PDGFRA, PMS2, POLD1, POLE, PTEN, RAD50, RAD51C, RAD51D, SDHB, SDHC, SDHD, SMAD4, SMARCA4. STK11, TP53, TSC1, TSC2, and VHL.  The following gene was evaluated for sequence changes only: SDHA and HOXB13 c.251G>A variant only.  The report date is January 30, 2017.         REVIEW OF SYSTEMS:   Constitutional: Denies fevers, chills or abnormal weight loss Eyes: Denies blurriness of vision Ears, nose, mouth, throat, and face: Denies mucositis or sore throat Respiratory: Denies cough, dyspnea or wheezes Cardiovascular: Denies palpitation, chest discomfort or  lower extremity swelling Gastrointestinal:  Denies nausea, heartburn or change in bowel habits Skin: Denies abnormal skin rashes Lymphatics: Denies new lymphadenopathy or easy bruising Neurological:Denies numbness, tingling or new weaknesses Behavioral/Psych: Mood is stable, no new changes  All other systems were reviewed with the patient and are negative.   VITALS:  Blood pressure 137/71, pulse 71, temperature 97.8 F (36.6 C), temperature source Oral, resp. rate 18, height '5\' 1"'  (1.549 m), weight 232 lb 4.8 oz (105.4 kg), SpO2 98 %.  Wt Readings from Last 3 Encounters:  03/27/22 232 lb 4.8 oz (105.4 kg)  03/14/22 228 lb 4 oz (103.5 kg)  01/31/22 236 lb 1.3 oz (107.1 kg)    Body mass index is 43.89 kg/m.  Performance status (ECOG): 1 - Symptomatic but completely ambulatory  PHYSICAL EXAM:   GENERAL:alert, no distress and comfortable SKIN: skin color, texture, turgor are normal, no rashes or significant lesions EYES: normal, Conjunctiva are pink and non-injected, sclera clear OROPHARYNX:no exudate, no erythema and lips, buccal mucosa, and tongue normal  NECK: supple, thyroid normal size, non-tender, without nodularity LYMPH:  no palpable  lymphadenopathy in the cervical, axillary or inguinal regions BREASTS: Both breasts are without masses. LUNGS: clear to auscultation and percussion with normal breathing effort HEART: regular rate & rhythm and no murmurs and no lower extremity edema ABDOMEN:abdomen soft, non-tender and normal bowel sounds Musculoskeletal:no cyanosis of digits and no clubbing  NEURO: alert & oriented x 3 with fluent speech, no focal motor/sensory deficits  LABORATORY DATA:  I have reviewed the data as listed    Component Value Date/Time   NA 141 03/27/2022 0000   K 4.1 03/27/2022 0000   CL 102 03/27/2022 0000   CO2 30 (A) 03/27/2022 0000   GLUCOSE 117 (H) 03/20/2017 0654   BUN 16 03/27/2022 0000   CREATININE 0.6 03/27/2022 0000   CREATININE 0.62 03/20/2017 0654   CALCIUM 8.9 03/27/2022 0000   PROT 6.7 11/03/2018 1022   ALBUMIN 4.4 03/27/2022 0000   ALBUMIN 4.1 11/03/2018 1022   AST 22 03/27/2022 0000   ALT 22 03/27/2022 0000   ALKPHOS 61 03/27/2022 0000   BILITOT 0.3 11/03/2018 1022   GFRNONAA >60 03/20/2017 0654   GFRAA >60 03/20/2017 0654    No results found for: SPEP, UPEP  Lab Results  Component Value Date   WBC 9.9 03/27/2022   NEUTROABS 6.05 03/27/2022   HGB 13.3 03/27/2022   HCT 41 03/27/2022   MCV 81 03/21/2021   PLT 203 03/27/2022      Chemistry      Component Value Date/Time   NA 141 03/27/2022 0000   K 4.1 03/27/2022 0000   CL 102 03/27/2022 0000   CO2 30 (A) 03/27/2022 0000   BUN 16 03/27/2022 0000   CREATININE 0.6 03/27/2022 0000   CREATININE 0.62 03/20/2017 0654   GLU 106 03/27/2022 0000      Component Value Date/Time   CALCIUM 8.9 03/27/2022 0000   ALKPHOS 61 03/27/2022 0000   AST 22 03/27/2022 0000   ALT 22 03/27/2022 0000   BILITOT 0.3 11/03/2018 1022

## 2022-03-27 ENCOUNTER — Telehealth: Payer: Self-pay | Admitting: Oncology

## 2022-03-27 ENCOUNTER — Other Ambulatory Visit: Payer: PRIVATE HEALTH INSURANCE

## 2022-03-27 ENCOUNTER — Encounter: Payer: Self-pay | Admitting: Oncology

## 2022-03-27 ENCOUNTER — Other Ambulatory Visit: Payer: Self-pay | Admitting: Oncology

## 2022-03-27 ENCOUNTER — Inpatient Hospital Stay: Payer: PRIVATE HEALTH INSURANCE | Attending: Oncology | Admitting: Oncology

## 2022-03-27 ENCOUNTER — Other Ambulatory Visit: Payer: Self-pay | Admitting: Pharmacist

## 2022-03-27 ENCOUNTER — Inpatient Hospital Stay: Payer: PRIVATE HEALTH INSURANCE

## 2022-03-27 VITALS — BP 137/71 | HR 71 | Temp 97.8°F | Resp 18 | Ht 61.0 in | Wt 232.3 lb

## 2022-03-27 DIAGNOSIS — M85832 Other specified disorders of bone density and structure, left forearm: Secondary | ICD-10-CM | POA: Insufficient documentation

## 2022-03-27 DIAGNOSIS — C50919 Malignant neoplasm of unspecified site of unspecified female breast: Secondary | ICD-10-CM | POA: Diagnosis not present

## 2022-03-27 DIAGNOSIS — Z13820 Encounter for screening for osteoporosis: Secondary | ICD-10-CM

## 2022-03-27 DIAGNOSIS — Z170 Estrogen receptor positive status [ER+]: Secondary | ICD-10-CM

## 2022-03-27 DIAGNOSIS — C50812 Malignant neoplasm of overlapping sites of left female breast: Secondary | ICD-10-CM | POA: Diagnosis not present

## 2022-03-27 DIAGNOSIS — Z853 Personal history of malignant neoplasm of breast: Secondary | ICD-10-CM

## 2022-03-27 DIAGNOSIS — M810 Age-related osteoporosis without current pathological fracture: Secondary | ICD-10-CM

## 2022-03-27 DIAGNOSIS — Z79811 Long term (current) use of aromatase inhibitors: Secondary | ICD-10-CM | POA: Insufficient documentation

## 2022-03-27 DIAGNOSIS — M8580 Other specified disorders of bone density and structure, unspecified site: Secondary | ICD-10-CM | POA: Insufficient documentation

## 2022-03-27 DIAGNOSIS — D518 Other vitamin B12 deficiency anemias: Secondary | ICD-10-CM | POA: Diagnosis not present

## 2022-03-27 DIAGNOSIS — C50912 Malignant neoplasm of unspecified site of left female breast: Secondary | ICD-10-CM | POA: Insufficient documentation

## 2022-03-27 DIAGNOSIS — M85851 Other specified disorders of bone density and structure, right thigh: Secondary | ICD-10-CM | POA: Insufficient documentation

## 2022-03-27 LAB — COMPREHENSIVE METABOLIC PANEL
Albumin: 4.4 (ref 3.5–5.0)
Calcium: 8.9 (ref 8.7–10.7)

## 2022-03-27 LAB — HEPATIC FUNCTION PANEL
ALT: 22 U/L (ref 7–35)
AST: 22 (ref 13–35)
Alkaline Phosphatase: 61 (ref 25–125)
Bilirubin, Total: 0.5

## 2022-03-27 LAB — BASIC METABOLIC PANEL
BUN: 16 (ref 4–21)
CO2: 30 — AB (ref 13–22)
Chloride: 102 (ref 99–108)
Creatinine: 0.6 (ref 0.5–1.1)
Glucose: 106
Potassium: 4.1 mEq/L (ref 3.5–5.1)
Sodium: 141 (ref 137–147)

## 2022-03-27 LAB — CBC: RBC: 5.13 — AB (ref 3.87–5.11)

## 2022-03-27 LAB — CBC AND DIFFERENTIAL
HCT: 41 (ref 36–46)
Hemoglobin: 13.3 (ref 12.0–16.0)
Neutrophils Absolute: 6.05
Platelets: 203 10*3/uL (ref 150–400)
WBC: 9.9

## 2022-03-27 NOTE — Progress Notes (Signed)
Hold B12 injections for now per Dr. Hinton Rao.  Plan is to repeat B12 level in the fall 2023 and if low, will resume at that time. ?

## 2022-03-27 NOTE — Telephone Encounter (Signed)
Per 03/27/22 los next appt scheduled and confirmed with patient ?

## 2022-03-31 ENCOUNTER — Other Ambulatory Visit: Payer: No Typology Code available for payment source

## 2022-03-31 ENCOUNTER — Ambulatory Visit: Payer: No Typology Code available for payment source | Admitting: Oncology

## 2022-04-01 ENCOUNTER — Other Ambulatory Visit: Payer: Self-pay | Admitting: Pharmacist

## 2022-04-02 ENCOUNTER — Ambulatory Visit: Payer: No Typology Code available for payment source

## 2022-04-04 ENCOUNTER — Ambulatory Visit: Payer: PRIVATE HEALTH INSURANCE

## 2022-04-11 ENCOUNTER — Ambulatory Visit: Payer: PRIVATE HEALTH INSURANCE

## 2022-04-12 ENCOUNTER — Encounter: Payer: Self-pay | Admitting: Oncology

## 2022-04-18 ENCOUNTER — Inpatient Hospital Stay: Payer: PRIVATE HEALTH INSURANCE

## 2022-04-18 VITALS — BP 123/68 | HR 70 | Temp 98.2°F | Resp 18 | Ht 61.0 in | Wt 236.0 lb

## 2022-04-18 DIAGNOSIS — M85832 Other specified disorders of bone density and structure, left forearm: Secondary | ICD-10-CM | POA: Diagnosis present

## 2022-04-18 DIAGNOSIS — Z79811 Long term (current) use of aromatase inhibitors: Secondary | ICD-10-CM | POA: Diagnosis not present

## 2022-04-18 DIAGNOSIS — D519 Vitamin B12 deficiency anemia, unspecified: Secondary | ICD-10-CM

## 2022-04-18 DIAGNOSIS — M8580 Other specified disorders of bone density and structure, unspecified site: Secondary | ICD-10-CM | POA: Diagnosis present

## 2022-04-18 DIAGNOSIS — M85851 Other specified disorders of bone density and structure, right thigh: Secondary | ICD-10-CM | POA: Diagnosis present

## 2022-04-18 DIAGNOSIS — C50912 Malignant neoplasm of unspecified site of left female breast: Secondary | ICD-10-CM | POA: Diagnosis not present

## 2022-04-18 MED ORDER — DENOSUMAB 60 MG/ML ~~LOC~~ SOSY
60.0000 mg | PREFILLED_SYRINGE | Freq: Once | SUBCUTANEOUS | Status: AC
  Administered 2022-04-18: 60 mg via SUBCUTANEOUS
  Filled 2022-04-18: qty 1

## 2022-04-18 NOTE — Patient Instructions (Signed)
Denosumab injection What is this medication? DENOSUMAB (den oh sue mab) slows bone breakdown. Prolia is used to treat osteoporosis in women after menopause and in men, and in people who are taking corticosteroids for 6 months or more. Xgeva is used to treat a high calcium level due to cancer and to prevent bone fractures and other bone problems caused by multiple myeloma or cancer bone metastases. Xgeva is also used to treat giant cell tumor of the bone. This medicine may be used for other purposes; ask your health care provider or pharmacist if you have questions. COMMON BRAND NAME(S): Prolia, XGEVA What should I tell my care team before I take this medication? They need to know if you have any of these conditions: dental disease having surgery or tooth extraction infection kidney disease low levels of calcium or Vitamin D in the blood malnutrition on hemodialysis skin conditions or sensitivity thyroid or parathyroid disease an unusual reaction to denosumab, other medicines, foods, dyes, or preservatives pregnant or trying to get pregnant breast-feeding How should I use this medication? This medicine is for injection under the skin. It is given by a health care professional in a hospital or clinic setting. A special MedGuide will be given to you before each treatment. Be sure to read this information carefully each time. For Prolia, talk to your pediatrician regarding the use of this medicine in children. Special care may be needed. For Xgeva, talk to your pediatrician regarding the use of this medicine in children. While this drug may be prescribed for children as young as 13 years for selected conditions, precautions do apply. Overdosage: If you think you have taken too much of this medicine contact a poison control center or emergency room at once. NOTE: This medicine is only for you. Do not share this medicine with others. What if I miss a dose? It is important not to miss your dose.  Call your doctor or health care professional if you are unable to keep an appointment. What may interact with this medication? Do not take this medicine with any of the following medications: other medicines containing denosumab This medicine may also interact with the following medications: medicines that lower your chance of fighting infection steroid medicines like prednisone or cortisone This list may not describe all possible interactions. Give your health care provider a list of all the medicines, herbs, non-prescription drugs, or dietary supplements you use. Also tell them if you smoke, drink alcohol, or use illegal drugs. Some items may interact with your medicine. What should I watch for while using this medication? Visit your doctor or health care professional for regular checks on your progress. Your doctor or health care professional may order blood tests and other tests to see how you are doing. Call your doctor or health care professional for advice if you get a fever, chills or sore throat, or other symptoms of a cold or flu. Do not treat yourself. This drug may decrease your body's ability to fight infection. Try to avoid being around people who are sick. You should make sure you get enough calcium and vitamin D while you are taking this medicine, unless your doctor tells you not to. Discuss the foods you eat and the vitamins you take with your health care professional. See your dentist regularly. Brush and floss your teeth as directed. Before you have any dental work done, tell your dentist you are receiving this medicine. Do not become pregnant while taking this medicine or for 5 months after   stopping it. Talk with your doctor or health care professional about your birth control options while taking this medicine. Women should inform their doctor if they wish to become pregnant or think they might be pregnant. There is a potential for serious side effects to an unborn child. Talk to  your health care professional or pharmacist for more information. What side effects may I notice from receiving this medication? Side effects that you should report to your doctor or health care professional as soon as possible: allergic reactions like skin rash, itching or hives, swelling of the face, lips, or tongue bone pain breathing problems dizziness jaw pain, especially after dental work redness, blistering, peeling of the skin signs and symptoms of infection like fever or chills; cough; sore throat; pain or trouble passing urine signs of low calcium like fast heartbeat, muscle cramps or muscle pain; pain, tingling, numbness in the hands or feet; seizures unusual bleeding or bruising unusually weak or tired Side effects that usually do not require medical attention (report to your doctor or health care professional if they continue or are bothersome): constipation diarrhea headache joint pain loss of appetite muscle pain runny nose tiredness upset stomach This list may not describe all possible side effects. Call your doctor for medical advice about side effects. You may report side effects to FDA at 1-800-FDA-1088. Where should I keep my medication? This medicine is only given in a clinic, doctor's office, or other health care setting and will not be stored at home. NOTE: This sheet is a summary. It may not cover all possible information. If you have questions about this medicine, talk to your doctor, pharmacist, or health care provider.  2023 Elsevier/Gold Standard (2018-03-19 00:00:00)  

## 2022-05-06 ENCOUNTER — Telehealth: Payer: Self-pay

## 2022-05-06 NOTE — Telephone Encounter (Signed)
-----   Message from [name], NP sent at 05/06/2022  2:02 PM EDT ----- Regarding: RE: dental cleaning/prolia Just a cleaning is fine. ----- Message ----- From: Daryel November, LPN Sent: 12/12/4172   1:51 PM EDT To: [name], NP Subject: dental cleaning/prolia                         Patient voiced she has a dental  cleaning visit sch for Thursday and last prolia was given 04/18/2022 . Does she need to resch or is this ok?

## 2022-05-06 NOTE — Telephone Encounter (Signed)
Left message for patient regular dental cleaning is fine. If further dental work is needed she needs to call back for further instructions.

## 2022-07-06 ENCOUNTER — Other Ambulatory Visit: Payer: Self-pay | Admitting: Oncology

## 2022-07-06 DIAGNOSIS — C50812 Malignant neoplasm of overlapping sites of left female breast: Secondary | ICD-10-CM

## 2022-07-06 DIAGNOSIS — Z170 Estrogen receptor positive status [ER+]: Secondary | ICD-10-CM

## 2022-07-06 NOTE — Telephone Encounter (Signed)
Completes 5 years in Sept 23

## 2022-07-11 ENCOUNTER — Encounter: Payer: Self-pay | Admitting: Oncology

## 2022-07-11 ENCOUNTER — Encounter (HOSPITAL_COMMUNITY): Payer: Self-pay

## 2022-07-11 ENCOUNTER — Emergency Department (HOSPITAL_COMMUNITY): Payer: 59

## 2022-07-11 ENCOUNTER — Emergency Department (HOSPITAL_COMMUNITY)
Admission: EM | Admit: 2022-07-11 | Discharge: 2022-07-11 | Disposition: A | Discharge status: Home / Self Care | Payer: 59 | Attending: Emergency Medicine | Admitting: Emergency Medicine

## 2022-07-11 DIAGNOSIS — R072 Precordial pain: Secondary | ICD-10-CM | POA: Diagnosis not present

## 2022-07-11 DIAGNOSIS — R0789 Other chest pain: Secondary | ICD-10-CM | POA: Insufficient documentation

## 2022-07-11 DIAGNOSIS — R079 Chest pain, unspecified: Secondary | ICD-10-CM | POA: Diagnosis present

## 2022-07-11 LAB — CBC
HCT: 39.9 % (ref 36.0–46.0)
Hemoglobin: 13.1 g/dL (ref 12.0–15.0)
MCH: 27.1 pg (ref 26.0–34.0)
MCHC: 32.8 g/dL (ref 30.0–36.0)
MCV: 82.4 fL (ref 80.0–100.0)
Platelets: 242 10*3/uL (ref 150–400)
RBC: 4.84 MIL/uL (ref 3.87–5.11)
RDW: 13.1 % (ref 11.5–15.5)
WBC: 10 10*3/uL (ref 4.0–10.5)
nRBC: 0 % (ref 0.0–0.2)

## 2022-07-11 LAB — BASIC METABOLIC PANEL
Anion gap: 7 (ref 5–15)
BUN: 14 mg/dL (ref 6–20)
CO2: 24 mmol/L (ref 22–32)
Calcium: 8.8 mg/dL — ABNORMAL LOW (ref 8.9–10.3)
Chloride: 110 mmol/L (ref 98–111)
Creatinine, Ser: 0.64 mg/dL (ref 0.44–1.00)
GFR, Estimated: >60 mL/min (ref >60)
Glucose, Bld: 129 mg/dL — ABNORMAL HIGH (ref 70–99)
Potassium: 4 mmol/L (ref 3.5–5.1)
Sodium: 141 mmol/L (ref 135–145)

## 2022-07-11 LAB — TROPONIN I (HIGH SENSITIVITY): Troponin I (High Sensitivity): 2 ng/L (ref <18)

## 2022-07-11 NOTE — Discharge Instructions (Addendum)
It was our pleasure to provide your ER care today - we hope that you feel better.  Follow up closely with your cardiologist in the coming week.   Return to ER right away if worse, new symptoms, recurrent/persistent chest pain, increased trouble breathing, or other concern.

## 2022-07-11 NOTE — ED Provider Notes (Signed)
[facility] DEPT Provider Note   CSN: [id number] Arrival date & time: 07/11/22  0859     History  Chief Complaint  Patient presents with   Chest Pain    Anne Lawrence is a 56 y.o. female.  Patient with c/o midline/lower chest pain last night, at rest. Symptoms acute onset, mild heaviness feeling, at rest, no radiating pain, no pleuritic pain. Lasted a few minutes. Had episode last week, also at rest. No exertional chest pain or discomfort. No new or increased cough or uri symptoms. ?hx gerd, no current heartburn. No leg pain or swelling, no recent surgery or immobility, no hx dvt or pe. +fam hx cad. Pt denies abd, flank, back or neck pain. No fever or chills. No associated sob or unusual doe. No vomiting. No diaphoresis.   The history is provided by the patient and medical records.  Chest Pain Associated symptoms: no abdominal pain, no back pain, no cough, no fever, no headache, no palpitations, no shortness of breath and no vomiting        Home Medications Prior to Admission medications   Medication Sig Start Date End Date Taking? Authorizing Provider  acetaminophen (TYLENOL) 500 MG tablet Take 500 mg by mouth every 6 (six) hours as needed for mild pain or moderate pain.    [provider]  anastrozole (ARIMIDEX) 1 MG tablet TAKE ONE TABLET BY MOUTH DAILY 07/06/22 02/03/23  Derwood Kaplan, MD  Ascorbic Acid (VITAMIN C) 100 MG tablet Take 100 mg by mouth daily.    [provider]  Carboxymethylcellul-Glycerin (LUBRICATING EYE DROPS OP) Apply 1 drop to eye daily as needed (dry eyes). Unknown strength    [provider]  cholecalciferol (VITAMIN D3) 25 MCG (1000 UNIT) tablet Take 1,000 Units by mouth daily.    [provider]  cholestyramine light (PREVALITE) 4 GM/DOSE powder Take 1 g by mouth daily. 02/20/21   [provider]  Cyanocobalamin (B-12 COMPLIANCE INJECTION IJ) Inject 100 mg as  directed every 30 (thirty) days.    [provider]  esomeprazole (NEXIUM) 40 MG capsule Take 40 mg by mouth daily as needed (heart burn).    [provider]  fluticasone (FLONASE) 50 MCG/ACT nasal spray Place 2 sprays into both nostrils daily. 04/21/19   [provider]  ibuprofen (ADVIL,MOTRIN) 200 MG tablet Take 200 mg by mouth every 6 (six) hours as needed for mild pain, moderate pain or cramping (takes 3 tablets).    [provider]  levocetirizine (XYZAL) 5 MG tablet Take 5 mg by mouth daily as needed for allergies.    [provider]  meloxicam (MOBIC) 15 MG tablet Take 15 mg by mouth daily. 04/21/19   [provider]  metoprolol succinate (TOPROL-XL) 25 MG 24 hr tablet Take 25 mg by mouth at bedtime.    [provider]  pseudoephedrine (SUDAFED) 30 MG tablet Take 30 mg by mouth every 4 (four) hours as needed for congestion.    [provider]  Sodium Chloride 3 % AERS Place 1 spray into the nose 4 (four) times daily as needed (sinus/ allergies). 08/15/20   [provider]  vitamin A 3 MG (10000 UNITS) capsule Take by mouth.    [provider]      Allergies    Sulfamethoxazole-trimethoprim    Review of Systems   Review of Systems  Constitutional:  Negative for fever.  Eyes:  Negative for redness.  Respiratory:  Negative for cough  and shortness of breath.   Cardiovascular:  Positive for chest pain. Negative for palpitations and leg swelling.  Gastrointestinal:  Negative for abdominal pain and vomiting.  Genitourinary:  Negative for flank pain.  Musculoskeletal:  Negative for back pain and neck pain.  Skin:  Negative for rash.  Neurological:  Negative for headaches.  Hematological:  Does not bruise/bleed easily.  Psychiatric/Behavioral:  Negative for confusion.     Physical Exam Updated Vital Signs BP (!) 144/77   Pulse 65   Temp 98.2 F (36.8 C) (Oral)   Resp 15   LMP  (LMP Unknown)  Comment: last injection in January - no more per Dr. Donne Hazel  SpO2 97%  Physical Exam Vitals and nursing note reviewed.  Constitutional:      Appearance: Normal appearance. She is well-developed.  HENT:     Head: Atraumatic.     Nose: Nose normal.     Mouth/Throat:     Mouth: Mucous membranes are moist.  Eyes:     General: No scleral icterus.    Conjunctiva/sclera: Conjunctivae normal.  Neck:     Trachea: No tracheal deviation.  Cardiovascular:     Rate and Rhythm: Normal rate and regular rhythm.     Pulses: Normal pulses.     Heart sounds: Normal heart sounds. No murmur heard.    No friction rub. No gallop.  Pulmonary:     Effort: Pulmonary effort is normal. No respiratory distress.     Breath sounds: Normal breath sounds.  Abdominal:     General: There is no distension.     Palpations: Abdomen is soft. There is no mass.     Tenderness: There is no abdominal tenderness.  Genitourinary:    Comments: No cva tenderness.  Musculoskeletal:        General: No swelling or tenderness.     Cervical back: Normal range of motion and neck supple. No rigidity. No muscular tenderness.     Right lower leg: No edema.     Left lower leg: No edema.  Skin:    General: Skin is warm and dry.     Findings: No rash.  Neurological:     Mental Status: She is alert.     Comments: Alert, speech normal.   Psychiatric:        Mood and Affect: Mood normal.     ED Results / Procedures / Treatments   Labs (all labs ordered are listed, but only abnormal results are displayed) Results for orders placed or performed during the hospital encounter of 70/96/28  Basic metabolic panel  Result Value Ref Range   Sodium 141 135 - 145 mmol/L   Potassium 4.0 3.5 - 5.1 mmol/L   Chloride 110 98 - 111 mmol/L   CO2 24 22 - 32 mmol/L   Glucose, Bld 129 (H) 70 - 99 mg/dL   BUN 14 6 - 20 mg/dL   Creatinine, Ser 0.64 0.44 - 1.00 mg/dL   Calcium 8.8 (L) 8.9 - 10.3 mg/dL   GFR, Estimated >60 >60 mL/min    Anion gap 7 5 - 15  CBC  Result Value Ref Range   WBC 10.0 4.0 - 10.5 K/uL   RBC 4.84 3.87 - 5.11 MIL/uL   Hemoglobin 13.1 12.0 - 15.0 g/dL   HCT 39.9 36.0 - 46.0 %   MCV 82.4 80.0 - 100.0 fL   MCH 27.1 26.0 - 34.0 pg   MCHC 32.8 30.0 - 36.0 g/dL   RDW 13.1 11.5 -  15.5 %   Platelets 242 150 - 400 K/uL   nRBC 0.0 0.0 - 0.2 %  Troponin I (High Sensitivity)  Result Value Ref Range   Troponin I (High Sensitivity) 2 <18 ng/L   DG Chest 2 View  Result Date: 07/11/2022 CLINICAL DATA:  Central chest pain beginning last night with intermittent left arm tingling. EXAM: CHEST - 2 VIEW COMPARISON:  06/01/2020 FINDINGS: Lungs are adequately inflated and otherwise clear. Cardiomediastinal silhouette is normal. Mild degenerative change of the spine. IMPRESSION: No active cardiopulmonary disease. Electronically Signed   By: Marin Olp M.D.   On: 07/11/2022 09:36     EKG EKG Interpretation  Date/Time:  Friday July 11 2022 09:09:39 EDT Ventricular Rate:  75 PR Interval:  131 QRS Duration: 81 QT Interval:  399 QTC Calculation: 446 R Axis:   52 Text Interpretation: Sinus rhythm Baseline wander Confirmed by Lajean Saver [phone]) on 07/11/2022 12:04:16 PM  Radiology DG Chest 2 View  Result Date: 07/11/2022 CLINICAL DATA:  Central chest pain beginning last night with intermittent left arm tingling. EXAM: CHEST - 2 VIEW COMPARISON:  06/01/2020 FINDINGS: Lungs are adequately inflated and otherwise clear. Cardiomediastinal silhouette is normal. Mild degenerative change of the spine. IMPRESSION: No active cardiopulmonary disease. Electronically Signed   By: Marin Olp M.D.   On: 07/11/2022 09:36    Procedures Procedures    Medications Ordered in ED Medications - No data to display  ED Course/ Medical Decision Making/ A&P                           Medical Decision Making Problems Addressed: Atypical chest pain: acute illness or injury Precordial chest pain: acute illness or injury with  systemic symptoms that poses a threat to life or bodily functions  Amount and/or Complexity of Data Reviewed External Data Reviewed: notes. Labs: ordered. Decision-making details documented in ED Course. Radiology: ordered and independent interpretation performed. Decision-making details documented in ED Course. ECG/medicine tests: ordered and independent interpretation performed. Decision-making details documented in ED Course.  Risk Decision regarding hospitalization.  Iv ns. Continuous pulse ox and cardiac monitoring. Labs ordered/sent. Imaging ordered.   Diff dx includes acs, msk cp, pna/ptx, gerd - Dispo decision including possible need for admission considered if trop high or other acute abn - will get labs and imaging and reassess.  Reviewed nursing notes and prior charts for additional history. External reports reviewed.   Cardiac monitor: sinus rhythm, rate 65.  Labs reviewed/interpreted by me - trop normal - symptoms occurred last night, at rest, now resolved - as such, feel no benefit of delta trop.   Xrays reviewed/interpreted by me - no pna.   Symptoms resolved. Rec close cardiology f/u.  Recheck again, no chest pain or discomfort, no sob.   Return precautions provided.            Final Clinical Impression(s) / ED Diagnoses Final diagnoses:  None    Rx / DC Orders ED Discharge Orders     None         Lajean Saver, MD 07/11/22 1206

## 2022-07-11 NOTE — ED Triage Notes (Signed)
Pt arrived via POV, c/o central chest pain that started last night with intermittent left arm tingling. States she had similar episode last week, self resolved. Pt with cardiac history.

## 2022-07-14 ENCOUNTER — Encounter: Payer: Self-pay | Admitting: Oncology

## 2022-07-14 ENCOUNTER — Encounter: Payer: Self-pay | Admitting: Cardiology

## 2022-07-14 ENCOUNTER — Ambulatory Visit (INDEPENDENT_AMBULATORY_CARE_PROVIDER_SITE_OTHER): Payer: 59 | Admitting: Cardiology

## 2022-07-14 VITALS — BP 144/80 | HR 71 | Ht 61.0 in | Wt 239.2 lb

## 2022-07-14 DIAGNOSIS — K2100 Gastro-esophageal reflux disease with esophagitis, without bleeding: Secondary | ICD-10-CM | POA: Diagnosis not present

## 2022-07-14 DIAGNOSIS — C50812 Malignant neoplasm of overlapping sites of left female breast: Secondary | ICD-10-CM

## 2022-07-14 DIAGNOSIS — R0789 Other chest pain: Secondary | ICD-10-CM | POA: Diagnosis not present

## 2022-07-14 DIAGNOSIS — Z170 Estrogen receptor positive status [ER+]: Secondary | ICD-10-CM | POA: Diagnosis not present

## 2022-07-14 MED ORDER — NITROGLYCERIN 0.4 MG SL SUBL
0.4000 mg | SUBLINGUAL_TABLET | SUBLINGUAL | 6 refills | Status: DC | PRN
Start: 2022-07-14 — End: 2024-01-22

## 2022-07-14 MED ORDER — ASPIRIN 81 MG PO TBEC: 81.0000 mg | DELAYED_RELEASE_TABLET | Freq: Every day | ORAL | 3 refills | Status: AC
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
## 2022-07-14 NOTE — Patient Instructions (Signed)
Medication Instructions:   Metoprolol '75mg'$  2 hours prior to CT Scan  Start: Enteric Coated Baby ASA '81mg'$  daily 1 tablet by mouth  *If you need a refill on your cardiac medications before your next appointment, please call your pharmacy*   Lab Work: None  If you have labs (blood work) drawn today and your tests are completely normal, you will receive your results only by: [place] (if you have MyChart) OR A paper copy in the mail If you have any lab test that is abnormal or we need to change your treatment, we will call you to review the results.   Testing/Procedures:   Your cardiac CT will be scheduled at one of the below locations:   [facility] [address] [place], [place] [address] [phone]   At [facility], please arrive at the [facility] (Entrance C2) of [facility] 30 minutes prior to test start time. You can use the FREE valet parking offered at entrance C (encouraged to control the heart rate for the test)  Proceed to the [facility] Radiology Department (first floor) to check-in and test prep.  All radiology patients and guests should use entrance C2 at [facility], accessed from [facility], even though the hospital's physical address listed is [address]      Please follow these instructions carefully (unless otherwise directed):    On the Night Before the Test: Be sure to Drink plenty of water. Do not consume any caffeinated/decaffeinated beverages or chocolate 12 hours prior to your test. Do not take any antihistamines 12 hours prior to your test.  On the Day of the Test: Drink plenty of water until 1 hour prior to the test. Do not eat any food 4 hours prior to the test. You may take your regular medications prior to the test.  Take metoprolol (Lopressor) two hours prior to test. HOLD Furosemide/Hydrochlorothiazide morning of the test. FEMALES- please  wear underwire-free bra if available, avoid dresses & tight clothing       After the Test: Drink plenty of water. After receiving IV contrast, you may experience a mild flushed feeling. This is normal. On occasion, you may experience a mild rash up to 24 hours after the test. This is not dangerous. If this occurs, you can take Benadryl 25 mg and increase your fluid intake. If you experience trouble breathing, this can be serious. If it is severe call 911 IMMEDIATELY. If it is mild, please call our office. If you take any of these medications: Glipizide/Metformin, Avandament, Glucavance, please do not take 48 hours after completing test unless otherwise instructed.  We will call to schedule your test 2-4 weeks out understanding that some insurance companies will need an authorization prior to the service being performed.   For non-scheduling related questions, please contact the cardiac imaging nurse navigator should you have any questions/concerns: Marchia Bond, Cardiac Imaging Nurse Navigator Gordy Clement, Cardiac Imaging Nurse Navigator [facility] Direct Office Dial: [phone]   For scheduling needs, including cancellations and rescheduling, please call [place], [phone].    Follow-Up: At [facility], you and your health needs are our priority.  As part of our continuing mission to provide you with exceptional heart care, we have created designated Provider Care Teams.  These Care Teams include your primary Cardiologist (physician) and Advanced Practice Providers (APPs -  Physician Assistants and Nurse Practitioners) who all work together to provide you  with the care you need, when you need it.  We recommend signing up for the patient portal called "MyChart".  Sign up information is provided on this After Visit Summary.  MyChart is used to connect with patients for Virtual Visits (Telemedicine).  Patients are able to view lab/test results, encounter  notes, upcoming appointments, etc.  Non-urgent messages can be sent to your provider as well.   To learn more about what you can do with MyChart, go to [email or web address].    Your next appointment:   6 month(s)  The format for your next appointment:   In Person  Provider:   Jenne Campus, MD   Other Instructions Cardiac CT Angiogram A cardiac CT angiogram is a procedure to look at the heart and the area around the heart. It may be done to help find the cause of chest pains or other symptoms of heart disease. During this procedure, a substance called contrast dye is injected into the blood vessels in the area to be checked. A large X-ray machine, called a CT scanner, then takes detailed pictures of the heart and the surrounding area. The procedure is also sometimes called a coronary CT angiogram, coronary artery scanning, or CTA. A cardiac CT angiogram allows the health care provider to see how well blood is flowing to and from the heart. The health care provider will be able to see if there are any problems, such as: Blockage or narrowing of the coronary arteries in the heart. Fluid around the heart. Signs of weakness or disease in the muscles, valves, and tissues of the heart. Tell a health care provider about: Any allergies you have. This is especially important if you have had a previous allergic reaction to contrast dye. All medicines you are taking, including vitamins, herbs, eye drops, creams, and over-the-counter medicines. Any blood disorders you have. Any surgeries you have had. Any medical conditions you have. Whether you are pregnant or may be pregnant. Any anxiety disorders, chronic pain, or other conditions you have that may increase your stress or prevent you from lying still. What are the risks? Generally, this is a safe procedure. However, problems may occur, including: Bleeding. Infection. Allergic reactions to medicines or dyes. Damage to other structures or  organs. Kidney damage from the contrast dye that is used. Increased risk of cancer from radiation exposure. This risk is low. Talk with your health care provider about: The risks and benefits of testing. How you can receive the lowest dose of radiation. What happens before the procedure? Wear comfortable clothing and remove any jewelry, glasses, dentures, and hearing aids. Follow instructions from your health care provider about eating and drinking. This may include: For 12 hours before the procedure -- avoid caffeine. This includes tea, coffee, soda, energy drinks, and diet pills. Drink plenty of water or other fluids that do not have caffeine in them. Being well hydrated can prevent complications. For 4-6 hours before the procedure -- stop eating and drinking. The contrast dye can cause nausea, but this is less likely if your stomach is empty. Ask your health care provider about changing or stopping your regular medicines. This is especially important if you are taking diabetes medicines, blood thinners, or medicines to treat problems with erections (erectile dysfunction). What happens during the procedure?  Hair on your chest may need to be removed so that small sticky patches called electrodes can be placed on your chest. These will transmit information that helps to monitor your heart during the procedure. An  IV will be inserted into one of your veins. You might be given a medicine to control your heart rate during the procedure. This will help to ensure that good images are obtained. You will be asked to lie on an exam table. This table will slide in and out of the CT machine during the procedure. Contrast dye will be injected into the IV. You might feel warm, or you may get a metallic taste in your mouth. You will be given a medicine called nitroglycerin. This will relax or dilate the arteries in your heart. The table that you are lying on will move into the CT machine tunnel for the  scan. The person running the machine will give you instructions while the scans are being done. You may be asked to: Keep your arms above your head. Hold your breath. Stay very still, even if the table is moving. When the scanning is complete, you will be moved out of the machine. The IV will be removed. The procedure may vary among health care providers and hospitals. What can I expect after the procedure? After your procedure, it is common to have: A metallic taste in your mouth from the contrast dye. A feeling of warmth. A headache from the nitroglycerin. Follow these instructions at home: Take over-the-counter and prescription medicines only as told by your health care provider. If you are told, drink enough fluid to keep your urine pale yellow. This will help to flush the contrast dye out of your body. Most people can return to their normal activities right after the procedure. Ask your health care provider what activities are safe for you. It is up to you to get the results of your procedure. Ask your health care provider, or the department that is doing the procedure, when your results will be ready. Keep all follow-up visits as told by your health care provider. This is important. Contact a health care provider if: You have any symptoms of allergy to the contrast dye. These include: Shortness of breath. Rash or hives. A racing heartbeat. Summary A cardiac CT angiogram is a procedure to look at the heart and the area around the heart. It may be done to help find the cause of chest pains or other symptoms of heart disease. During this procedure, a large X-ray machine, called a CT scanner, takes detailed pictures of the heart and the surrounding area after a contrast dye has been injected into blood vessels in the area. Ask your health care provider about changing or stopping your regular medicines before the procedure. This is especially important if you are taking diabetes  medicines, blood thinners, or medicines to treat erectile dysfunction. If you are told, drink enough fluid to keep your urine pale yellow. This will help to flush the contrast dye out of your body. This information is not intended to replace advice given to you by your health care provider. Make sure you discuss any questions you have with your health care provider. Document Revised: 07/06/2019 Document Reviewed: 07/06/2019 Elsevier Patient Education  [place].

## 2022-07-15 ENCOUNTER — Encounter: Payer: Self-pay | Admitting: Oncology

## 2022-07-24 ENCOUNTER — Telehealth: Payer: Self-pay

## 2022-07-24 NOTE — Telephone Encounter (Signed)
Pt called to clarify when she is to stop her 5 yr anastrozole treatment. I read Dr Remi Deter progress note and pt is to take the anastrozole through Sept 2023 and then stop. Pt verbalized understanding.

## 2022-08-13 ENCOUNTER — Telehealth (HOSPITAL_COMMUNITY): Payer: Self-pay | Admitting: *Deleted

## 2022-08-13 NOTE — Telephone Encounter (Signed)
Attempted to call patient regarding upcoming cardiac CT appointment. °Left message on voicemail with name and callback number ° °Jezebel Pollet RN Navigator Cardiac Imaging °[facility] °[phone] Office °[phone] Cell ° °

## 2022-08-13 NOTE — Telephone Encounter (Signed)
Reaching out to patient to offer assistance regarding upcoming cardiac imaging study; pt verbalizes understanding of appt date/time, parking situation and where to check in, and verified current allergies; name and call back number provided for further questions should they arise  Gordy Clement RN Navigator Cardiac Imaging [facility] [phone] office [phone] cell  Patient to take an extra dose of metoprolol succinate two hours prior to her cardiac CT scan.  She aware to arrive to arrive 9:30am.

## 2022-08-14 ENCOUNTER — Ambulatory Visit (HOSPITAL_COMMUNITY)
Admission: RE | Admit: 2022-08-14 | Discharge: 2022-08-14 | Disposition: A | Discharge status: Home / Self Care | Payer: 59 | Source: Ambulatory Visit | Attending: Cardiology | Admitting: Cardiology

## 2022-08-14 DIAGNOSIS — R0789 Other chest pain: Secondary | ICD-10-CM | POA: Insufficient documentation

## 2022-08-14 MED ORDER — IOHEXOL 350 MG/ML SOLN
100.0000 mL | Freq: Once | INTRAVENOUS | Status: AC | PRN
  Administered 2022-08-14: 100 mL via INTRAVENOUS

## 2022-08-14 MED ORDER — NITROGLYCERIN 0.4 MG SL SUBL
SUBLINGUAL_TABLET | SUBLINGUAL | Status: AC
  Filled 2022-08-14: qty 2

## 2022-08-14 MED ORDER — METOPROLOL TARTRATE 5 MG/5ML IV SOLN
5.0000 mg | Freq: Once | INTRAVENOUS | Status: AC
Start: 2022-08-14 — End: 2022-08-14

## 2022-08-14 MED ORDER — NITROGLYCERIN 0.4 MG SL SUBL
0.8000 mg | SUBLINGUAL_TABLET | Freq: Once | SUBLINGUAL | Status: AC
  Administered 2022-08-14: 0.8 mg via SUBLINGUAL

## 2022-08-14 MED ORDER — METOPROLOL TARTRATE 5 MG/5ML IV SOLN
INTRAVENOUS | Status: AC
  Administered 2022-08-14: 5 mg via INTRAVENOUS
  Filled 2022-08-14: qty 10

## 2022-08-18 ENCOUNTER — Other Ambulatory Visit: Payer: Self-pay | Admitting: Oncology

## 2022-08-18 DIAGNOSIS — Z170 Estrogen receptor positive status [ER+]: Secondary | ICD-10-CM

## 2022-08-18 NOTE — Telephone Encounter (Signed)
She has completed her 5 years

## 2022-08-21 ENCOUNTER — Telehealth: Payer: Self-pay

## 2022-08-21 NOTE — Telephone Encounter (Signed)
-----   Message from Park Liter, MD sent at 08/15/2022 12:56 PM EDT ----- Coronary CT angio showing no evidence of coronary artery disease

## 2022-08-21 NOTE — Telephone Encounter (Signed)
Patient notified of results.

## 2022-10-07 ENCOUNTER — Other Ambulatory Visit: Payer: Self-pay | Admitting: Hematology and Oncology

## 2022-10-07 DIAGNOSIS — C50812 Malignant neoplasm of overlapping sites of left female breast: Secondary | ICD-10-CM

## 2022-10-08 NOTE — Assessment & Plan Note (Signed)
She initially was treated with B12 injections. She was switched to oral B12 in May.

## 2022-10-08 NOTE — Assessment & Plan Note (Signed)
Osteopenia with a t-score of -2.1 in the radius and a t-score of -1.3 in the right femur neck. She will proceed with Prolia this week. We will plan to see her back in 6 months with a repeat bone density scan.
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
## 2022-10-08 NOTE — Assessment & Plan Note (Signed)
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  She was treated with surgery and adjuvant hormonal therapy with anastrozole 1 mg daily.  She completed 5 years of anastrozole therapy in September.  Bilateral diagnostic mammogram in March did not reveal any evidence of malignancy. She remains without evidence of recurrence. We will plan to see her back in 6 months with a CBC, comprehensive metabolic profile and bilateral screening mammogram.

## 2022-10-09 ENCOUNTER — Encounter: Payer: Self-pay | Admitting: Hematology and Oncology

## 2022-10-09 ENCOUNTER — Inpatient Hospital Stay: Payer: 59

## 2022-10-09 ENCOUNTER — Telehealth: Payer: Self-pay

## 2022-10-09 ENCOUNTER — Inpatient Hospital Stay: Payer: 59 | Attending: Oncology | Admitting: Hematology and Oncology

## 2022-10-09 VITALS — BP 161/71 | HR 71 | Temp 98.8°F | Resp 18 | Ht 61.0 in | Wt 254.0 lb

## 2022-10-09 DIAGNOSIS — C50812 Malignant neoplasm of overlapping sites of left female breast: Secondary | ICD-10-CM

## 2022-10-09 DIAGNOSIS — D519 Vitamin B12 deficiency anemia, unspecified: Secondary | ICD-10-CM | POA: Insufficient documentation

## 2022-10-09 DIAGNOSIS — M8580 Other specified disorders of bone density and structure, unspecified site: Secondary | ICD-10-CM | POA: Diagnosis not present

## 2022-10-09 DIAGNOSIS — Z780 Asymptomatic menopausal state: Secondary | ICD-10-CM

## 2022-10-09 DIAGNOSIS — Z170 Estrogen receptor positive status [ER+]: Secondary | ICD-10-CM

## 2022-10-09 LAB — CMP (CANCER CENTER ONLY)
ALT: 25 U/L (ref 0–44)
AST: 19 U/L (ref 15–41)
Albumin: 4 g/dL (ref 3.5–5.0)
Alkaline Phosphatase: 65 U/L (ref 38–126)
Anion gap: 11 (ref 5–15)
BUN: 13 mg/dL (ref 6–20)
CO2: 28 mmol/L (ref 22–32)
Calcium: 9.3 mg/dL (ref 8.9–10.3)
Chloride: 103 mmol/L (ref 98–111)
Creatinine: 0.61 mg/dL (ref 0.44–1.00)
GFR, Estimated: >60 mL/min (ref >60)
Glucose, Bld: 119 mg/dL — ABNORMAL HIGH (ref 70–99)
Potassium: 3.9 mmol/L (ref 3.5–5.1)
Sodium: 142 mmol/L (ref 135–145)
Total Bilirubin: 0.4 mg/dL (ref 0.3–1.2)
Total Protein: 7.5 g/dL (ref 6.5–8.1)

## 2022-10-09 LAB — CBC WITH DIFFERENTIAL (CANCER CENTER ONLY)
Abs Immature Granulocytes: 0.03 10*3/uL (ref 0.00–0.07)
Basophils Absolute: 0 10*3/uL (ref 0.0–0.1)
Basophils Relative: 1 %
Eosinophils Absolute: 0.2 10*3/uL (ref 0.0–0.5)
Eosinophils Relative: 2 %
HCT: 40.8 % (ref 36.0–46.0)
Hemoglobin: 12.8 g/dL (ref 12.0–15.0)
Immature Granulocytes: 0 %
Lymphocytes Relative: 29 %
Lymphs Abs: 2.5 10*3/uL (ref 0.7–4.0)
MCH: 26.2 pg (ref 26.0–34.0)
MCHC: 31.4 g/dL (ref 30.0–36.0)
MCV: 83.4 fL (ref 80.0–100.0)
Monocytes Absolute: 0.6 10*3/uL (ref 0.1–1.0)
Monocytes Relative: 7 %
Neutro Abs: 5.3 10*3/uL (ref 1.7–7.7)
Neutrophils Relative %: 61 %
Platelet Count: 225 10*3/uL (ref 150–400)
RBC: 4.89 MIL/uL (ref 3.87–5.11)
RDW: 13.3 % (ref 11.5–15.5)
WBC Count: 8.6 10*3/uL (ref 4.0–10.5)
nRBC: 0 % (ref 0.0–0.2)

## 2022-10-09 LAB — VITAMIN B12: Vitamin B-12: 218 pg/mL (ref 180–914)

## 2022-10-09 NOTE — Telephone Encounter (Signed)
-----   Message from Marvia Pickles, PA-C sent at 10/09/2022 10:13 AM EST ----- Please refer to lymphedema specialist at [facility] to reevaluate left arm and breast edema

## 2022-10-09 NOTE — Telephone Encounter (Signed)
Patient notified and voiced understanding.

## 2022-10-09 NOTE — Telephone Encounter (Signed)
Referral sent to [facility] [place]  [address].  Phone [phone]  Patient aware at appt today and called with contact info and voiced understanding.

## 2022-10-09 NOTE — Telephone Encounter (Signed)
-----   Message from Marvia Pickles, PA-C sent at 10/09/2022  2:39 PM EST ----- Please let her know her labs are normal.  Okay to proceed with Prolia tomorrow.

## 2022-10-10 ENCOUNTER — Telehealth: Payer: Self-pay

## 2022-10-10 ENCOUNTER — Inpatient Hospital Stay: Payer: 59

## 2022-10-10 VITALS — BP 158/89 | HR 70 | Temp 98.1°F | Resp 16 | Ht 61.0 in | Wt 258.0 lb

## 2022-10-10 DIAGNOSIS — M8580 Other specified disorders of bone density and structure, unspecified site: Secondary | ICD-10-CM | POA: Diagnosis not present

## 2022-10-10 DIAGNOSIS — D519 Vitamin B12 deficiency anemia, unspecified: Secondary | ICD-10-CM

## 2022-10-10 MED ORDER — DENOSUMAB 60 MG/ML ~~LOC~~ SOSY
60.0000 mg | PREFILLED_SYRINGE | Freq: Once | SUBCUTANEOUS | Status: AC
  Administered 2022-10-10: 60 mg via SUBCUTANEOUS
  Filled 2022-10-10: qty 1

## 2022-10-10 NOTE — Telephone Encounter (Signed)
Patient notified and voiced understanding. Will start oral B 12 supplement.

## 2022-10-10 NOTE — Telephone Encounter (Signed)
-----   Message from Marvia Pickles, PA-C sent at 10/10/2022 12:11 PM EST ----- Please let her know her B12 was low normal again, I recommend she resume an oral B12 supplement. If she has been taking this, I would place her back on injections. Thanks

## 2022-10-20 ENCOUNTER — Encounter: Payer: Self-pay | Admitting: Oncology

## 2022-10-21 ENCOUNTER — Encounter: Payer: Self-pay | Admitting: Oncology

## 2022-10-21 DIAGNOSIS — C50812 Malignant neoplasm of overlapping sites of left female breast: Secondary | ICD-10-CM | POA: Insufficient documentation

## 2022-12-06 ENCOUNTER — Other Ambulatory Visit: Payer: Self-pay | Admitting: Gastroenterology

## 2022-12-14 ENCOUNTER — Other Ambulatory Visit: Payer: Self-pay | Admitting: Gastroenterology

## 2022-12-22 ENCOUNTER — Telehealth: Payer: Self-pay | Admitting: Gastroenterology

## 2022-12-22 NOTE — Telephone Encounter (Signed)
Patient is requesting refill for Prevalite. Patient has not been seen since 2021, scheduled follow up with Dr. Lyndel Safe for 3/5. Would like refill sent to [facility] in [place] on [address].

## 2022-12-22 NOTE — Telephone Encounter (Signed)
Please advise 

## 2022-12-24 NOTE — Telephone Encounter (Signed)
Okay for Prevalite (cholestyramine) 4 g p.o. daily, take 2 hours before or after rest of the medications #30, 6 RF RG

## 2022-12-26 MED ORDER — CHOLESTYRAMINE LIGHT 4 GM/DOSE PO POWD: 4.0000 g | Freq: Every day | ORAL | 6 refills | Status: DC

## 2022-12-26 MED ORDER — CHOLESTYRAMINE 4 G PO PACK: 4.0000 g | PACK | Freq: Every day | ORAL | 6 refills | Status: DC

## 2022-12-26 NOTE — Telephone Encounter (Signed)
New script sent

## 2022-12-26 NOTE — Telephone Encounter (Signed)
Melissa from [facility] is calling asking for Prevalite to be switched to the normal Rx not the generic. Please advise

## 2022-12-26 NOTE — Addendum Note (Signed)
Addended by: Horris Latino on: 12/26/2022 02:20 PM   Modules accepted: Orders

## 2022-12-26 NOTE — Telephone Encounter (Signed)
Script sent to pharmacy.

## 2022-12-29 ENCOUNTER — Other Ambulatory Visit: Payer: Self-pay | Admitting: Gastroenterology

## 2023-01-06 IMAGING — MG DIGITAL DIAGNOSTIC BILAT W/ TOMO W/ CAD
6 of 9 series · 6 of 25 positions shown · non-contrast
Comparison: Previous exam(s).

CLINICAL DATA: Left lumpectomy.  Annual mammography.

EXAM:
DIGITAL DIAGNOSTIC BILATERAL MAMMOGRAM WITH TOMOSYNTHESIS AND CAD
TECHNIQUE: Bilateral digital diagnostic mammography and breast tomosynthesis
was performed. The images were evaluated with computer-aided
detection.

[L TAN]
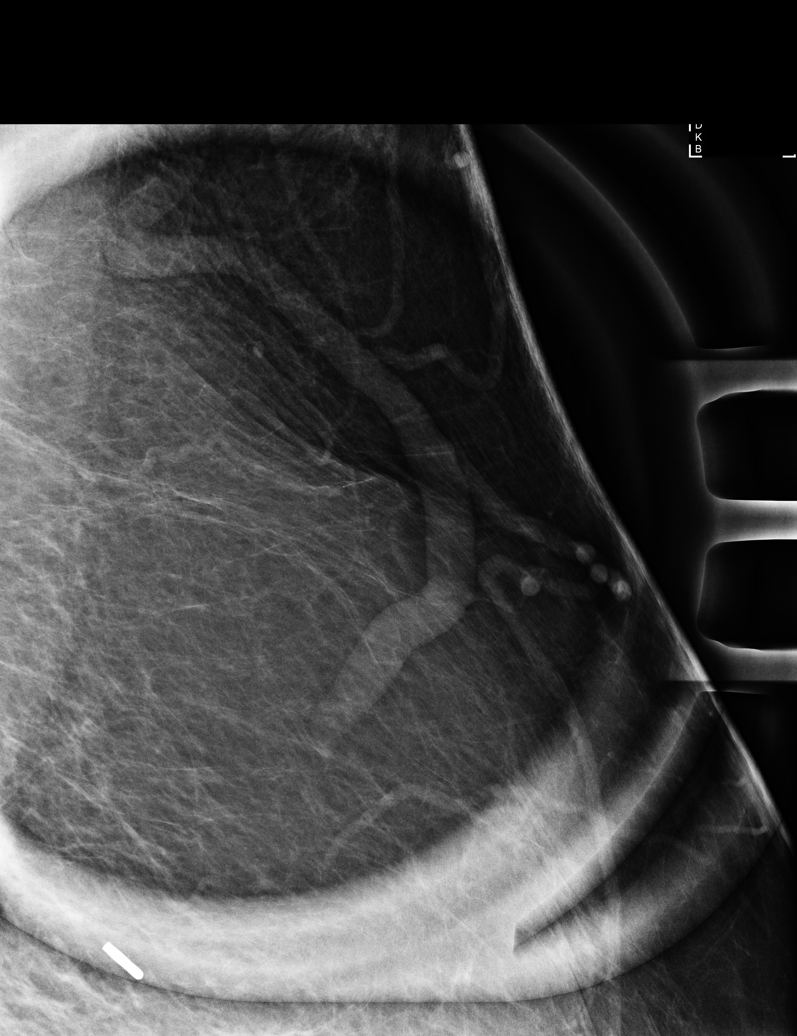

[R CC synth-2D]
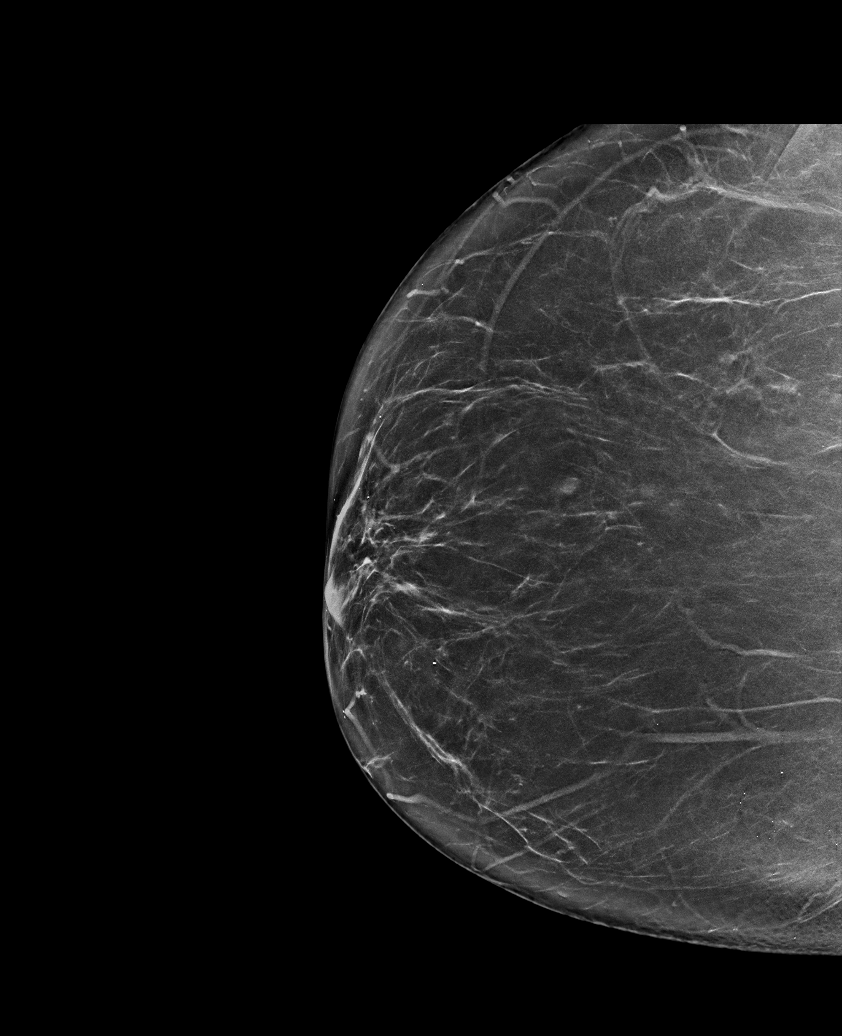

[L MLO synth-2D]
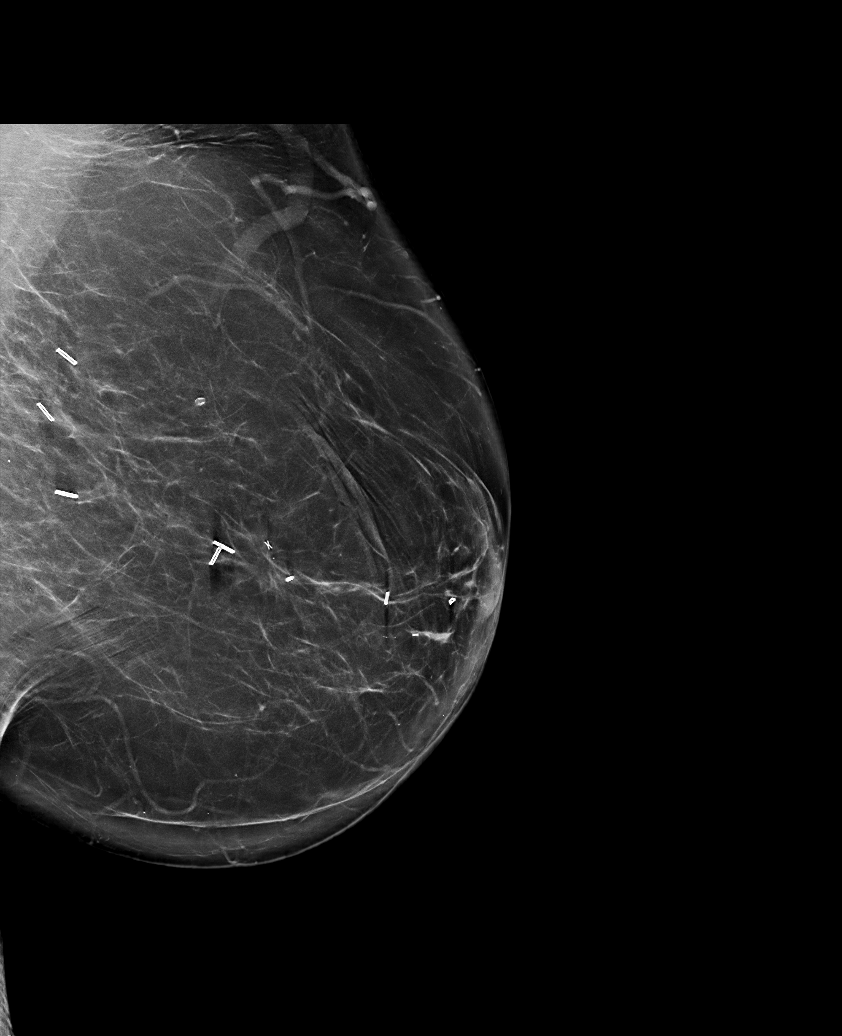

[R MLO synth-2D]
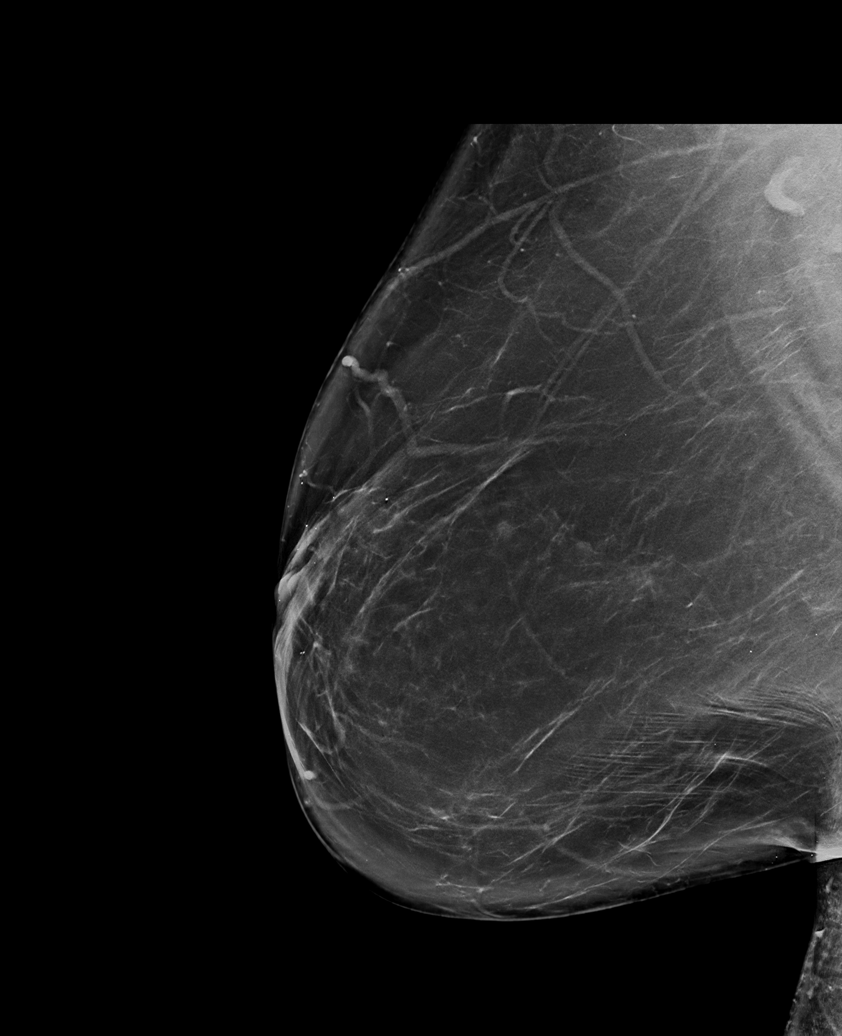

[L CC synth-2D]
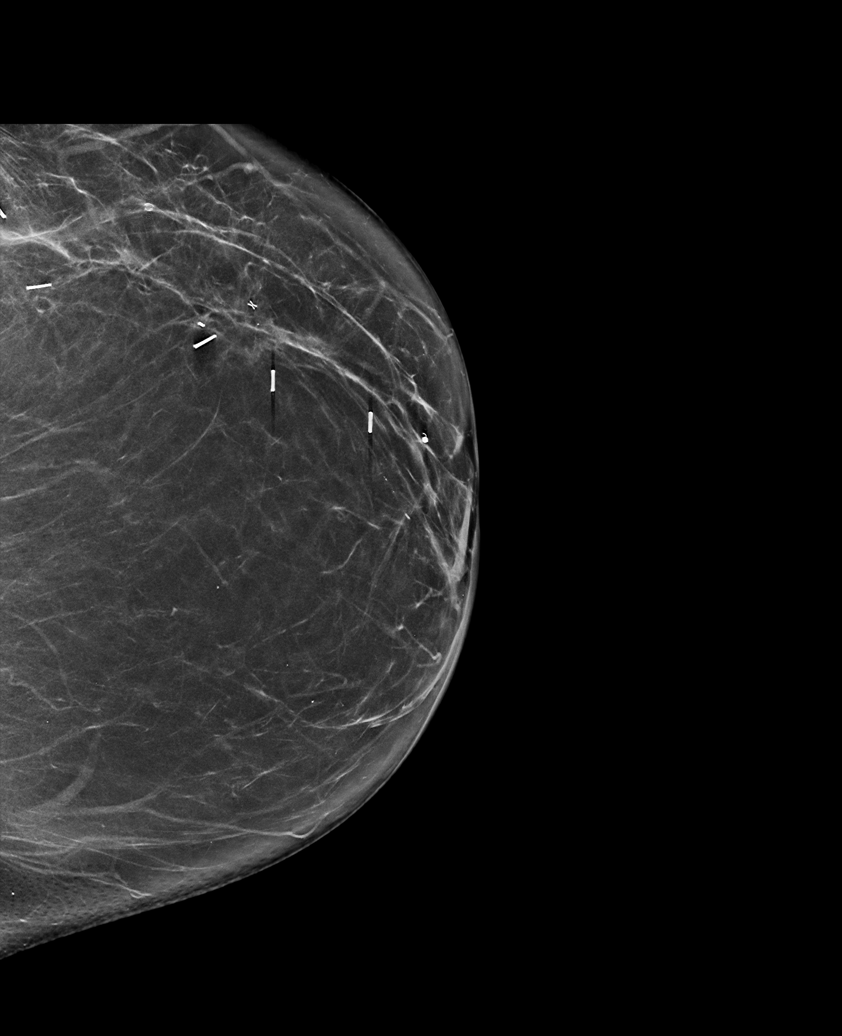

[R MLO tomo · tomo slice 49/97.0]
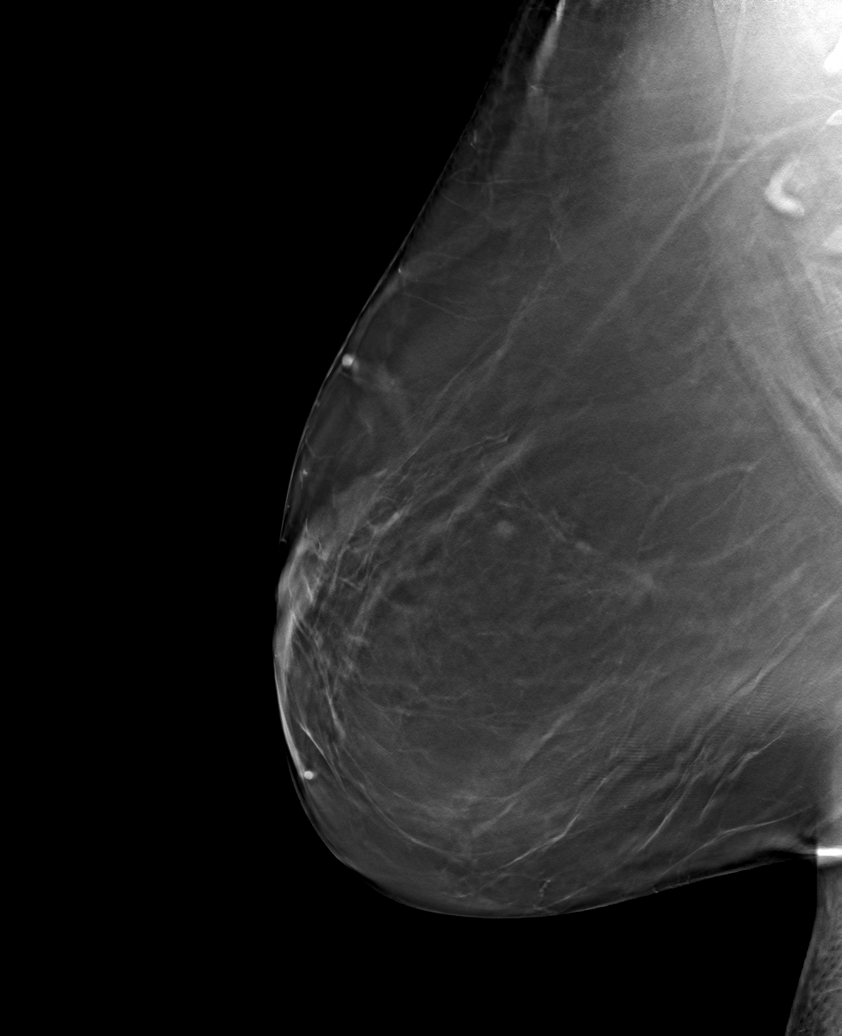

[6 of 25 positions shown; findings below may reference images not displayed]

ACR Breast Density Category b: There are scattered areas of
fibroglandular density.
FINDINGS: The left lumpectomy is stable. No suspicious masses, calcifications,
or distortion are identified in either breast.
IMPRESSION: No mammographic evidence of malignancy.

RECOMMENDATION:
Per protocol, as the patient is now 2 or more years status post
lumpectomy, she may return to annual screening mammography in 1
year. However, given the history of breast cancer, the patient
remains eligible for annual diagnostic mammography if preferred.

I have discussed the findings and recommendations with the patient.
If applicable, a reminder letter will be sent to the patient
regarding the next appointment.

BI-RADS CATEGORY  2: Benign.

## 2023-01-15 ENCOUNTER — Ambulatory Visit: Payer: 59 | Attending: Cardiology | Admitting: Cardiology

## 2023-01-15 ENCOUNTER — Encounter: Payer: Self-pay | Admitting: Cardiology

## 2023-01-15 VITALS — BP 156/88 | HR 80 | Ht 61.0 in | Wt 264.0 lb

## 2023-01-15 DIAGNOSIS — R0789 Other chest pain: Secondary | ICD-10-CM

## 2023-01-15 DIAGNOSIS — R9439 Abnormal result of other cardiovascular function study: Secondary | ICD-10-CM

## 2023-01-15 DIAGNOSIS — K2100 Gastro-esophageal reflux disease with esophagitis, without bleeding: Secondary | ICD-10-CM | POA: Diagnosis not present

## 2023-01-15 DIAGNOSIS — F419 Anxiety disorder, unspecified: Secondary | ICD-10-CM

## 2023-01-15 DIAGNOSIS — E785 Hyperlipidemia, unspecified: Secondary | ICD-10-CM

## 2023-01-15 DIAGNOSIS — C50812 Malignant neoplasm of overlapping sites of left female breast: Secondary | ICD-10-CM

## 2023-01-15 DIAGNOSIS — Z170 Estrogen receptor positive status [ER+]: Secondary | ICD-10-CM
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
## 2023-01-15 NOTE — Patient Instructions (Signed)

## 2023-01-27 ENCOUNTER — Encounter: Payer: Self-pay | Admitting: Gastroenterology

## 2023-01-27 ENCOUNTER — Ambulatory Visit: Payer: 59 | Admitting: Gastroenterology

## 2023-01-27 VITALS — BP 140/82 | HR 77 | Ht 61.0 in | Wt 264.1 lb

## 2023-01-27 DIAGNOSIS — K580 Irritable bowel syndrome with diarrhea: Secondary | ICD-10-CM | POA: Diagnosis not present

## 2023-01-27 DIAGNOSIS — R1310 Dysphagia, unspecified: Secondary | ICD-10-CM | POA: Diagnosis not present

## 2023-01-27 DIAGNOSIS — Z800 Family history of malignant neoplasm of digestive organs: Secondary | ICD-10-CM

## 2023-01-27 DIAGNOSIS — K219 Gastro-esophageal reflux disease without esophagitis: Secondary | ICD-10-CM

## 2023-01-27 DIAGNOSIS — K760 Fatty (change of) liver, not elsewhere classified: Secondary | ICD-10-CM

## 2023-01-27 MED ORDER — ESOMEPRAZOLE MAGNESIUM 40 MG PO CPDR: 40.0000 mg | DELAYED_RELEASE_CAPSULE | Freq: Every day | ORAL | 4 refills | Status: DC

## 2023-01-27 MED ORDER — CHOLESTYRAMINE 4 G PO PACK: 4.0000 g | PACK | Freq: Every day | ORAL | 11 refills | Status: DC
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
## 2023-01-27 NOTE — Patient Instructions (Addendum)
_______________________________________________________  If your blood pressure at your visit was 140/90 or greater, please contact your primary care physician to follow up on this.  _______________________________________________________  If you are age 57 or older, your body mass index should be between 23-30. Your Body mass index is 49.91 kg/m. If this is out of the aforementioned range listed, please consider follow up with your Primary Care Provider.  If you are age 20 or younger, your body mass index should be between 19-25. Your Body mass index is 49.91 kg/m. If this is out of the aformentioned range listed, please consider follow up with your Primary Care Provider.   ________________________________________________________  The [facility] providers would like to encourage you to use [facility] to communicate with providers for non-urgent requests or questions.  Due to long hold times on the telephone, sending your provider a message by [facility] may be a faster and more efficient way to get a response.  Please allow 48 business hours for a response.  Please remember that this is for non-urgent requests.  _______________________________________________________  Dennis Bast have been scheduled for an endoscopy and colonoscopy. Please follow the written instructions given to you at your visit today. Please pick up your prep supplies at the pharmacy within the next 1-3 days. If you use inhalers (even only as needed), please bring them with you on the day of your procedure.  We have sent the following medications to your pharmacy for you to pick up at your convenience: Nexium daily Questran  A referral has been sent to [facility]  Please call with any questions or concerns.  Thank you,  Dr. Jackquline Denmark

## 2023-02-16 ENCOUNTER — Telehealth: Payer: Self-pay

## 2023-02-16 NOTE — Telephone Encounter (Signed)
   Anne Lawrence Phy,RPH: No risk for bleeding and no reason to reschedule in my opinion. She will see Dr. Hinton Rao prior to injection and she can discuss it with her at that time.   Gurjot Brisco,RN: Pt called to ask if the Prolia injection due 04/16/2023 would need to be rescheduled, as she is having an EGD and colonoscopy on 03/25/2023? She wanted to make sure it was far enough apart?

## 2023-02-23 ENCOUNTER — Ambulatory Visit
Admission: RE | Admit: 2023-02-23 | Discharge: 2023-02-23 | Disposition: A | Discharge status: Home / Self Care | Payer: 59 | Source: Ambulatory Visit | Attending: Hematology and Oncology | Admitting: Hematology and Oncology

## 2023-02-23 ENCOUNTER — Encounter: Payer: Self-pay | Admitting: Oncology

## 2023-02-23 DIAGNOSIS — Z170 Estrogen receptor positive status [ER+]: Secondary | ICD-10-CM

## 2023-03-12 ENCOUNTER — Ambulatory Visit: Payer: Managed Care, Other (non HMO) | Attending: Cardiology

## 2023-03-12 ENCOUNTER — Other Ambulatory Visit: Payer: Self-pay

## 2023-03-12 ENCOUNTER — Telehealth: Payer: Self-pay | Admitting: Cardiology

## 2023-03-12 ENCOUNTER — Encounter: Payer: Self-pay | Admitting: Oncology

## 2023-03-12 VITALS — BP 150/78 | HR 60 | Wt 261.4 lb

## 2023-03-12 DIAGNOSIS — R0789 Other chest pain: Secondary | ICD-10-CM

## 2023-03-12 LAB — TROPONIN T: Troponin T (Highly Sensitive): 7 ng/L (ref 0–14)

## 2023-03-12 MED ORDER — AMLODIPINE BESYLATE 5 MG PO TABS: 5.0000 mg | ORAL_TABLET | Freq: Every day | ORAL | 3 refills | Status: DC

## 2023-03-12 NOTE — Telephone Encounter (Signed)
Called patient and she reported that her blood pressure has been elevated over the past couple days. Yesterday her blood pressure in the morning was 177/94 and after a couple of hours she checked it again and it was 184/102. This morning her blood pressure was 179/98 and she reported having a headache. She is concerned that home blood pressure machine is not working properly so I set-up a nurse visit to compare her BP machine with our readings.

## 2023-03-12 NOTE — Progress Notes (Signed)
   Nurse Visit   Date of Encounter: 03/12/2023 ID: Anne Lawrence, DOB [birth year], MRN [id number]  PCP:  Hadley Pen, MD   [facility] Providers Cardiologist:  Gypsy Balsam, MD      Visit Details   VS:  BP (!) 150/78 (BP Location: Right Arm, Patient Position: Sitting, Cuff Size: Normal)   Pulse 60   Wt 261 lb 6.4 oz (118.6 kg)   LMP  (LMP Unknown) Comment: last injection in January - no more per Dr. Dwain Sarna  BMI 49.39 kg/m  , BMI Body mass index is 49.39 kg/m.  Wt Readings from Last 3 Encounters:  03/12/23 261 lb 6.4 oz (118.6 kg)  01/27/23 264 lb 2 oz (119.8 kg)  01/15/23 264 lb (119.7 kg)     Reason for visit: Perform blood pressure check Performed today: Vitals, Provider consulted and Education Changes (medications, testing, etc.) : Patient was started on Amlodipine 5 mg daily and during the nurse visit she also reported having chest pressure yesterday with a headache. Dr. Bing Matter was informed of this and a STAT troponin was ordered. Length of Visit: 25 minutes    Medications Adjustments/Labs and Tests Ordered: Orders Placed This Encounter  Procedures   Troponin T, STAT ([facility])   No orders of the defined types were placed in this encounter.    Signed, Samson Frederic, RN  03/12/2023 11:26 AM

## 2023-03-12 NOTE — Telephone Encounter (Signed)
Pt c/o BP issue: STAT if pt c/o blurred vision, one-sided weakness or slurred speech  1. What are your last 5 BP readings?  177/94 - Yesterday morning 184/102 - Late yesterday morning 179/98 - This morning 2. Are you having any other symptoms (ex. Dizziness, headache, blurred vision, passed out)? Headache  3. What is your BP issue? Pt states BP has been elevated since yesterday morning. Pt would like to know if she is able to come into office to have BP checked incase her BP monitor is not reading correctly. Please advise

## 2023-03-16 ENCOUNTER — Telehealth: Payer: Self-pay | Admitting: Cardiology

## 2023-03-16 NOTE — Telephone Encounter (Signed)
Advised that BP should be taken 1-2 hours after medication and again in the evening. BP log sent in My Chart. Pt verbalized understanding and had no additional questions.

## 2023-03-16 NOTE — Telephone Encounter (Signed)
Patient stated she will be recording her BP as requested by Dr. Bing Matter but wants further information on how to record BP - in the morning? twice a day?  Patient wants to know if she should take her BP before or after her medication.  Patient wants to know if she should record days without medication prior to starting on the medication.

## 2023-03-19 ENCOUNTER — Telehealth: Payer: Self-pay | Admitting: Gastroenterology

## 2023-03-19 NOTE — Telephone Encounter (Signed)
Inbound call from patient has double procedure 5/1. Patient states she was previously prescribed blood pressure medication. She would like to speak to a nurse to see when she should take it I regards to her procedure day.

## 2023-03-19 NOTE — Telephone Encounter (Signed)
Patient stated that she was stated on amlodipine  but haven't taken it yet through her cardiologist. But her blood pressure has been running elevated since March and she did a nurse visit with cardiology on 4-18 and her lab came back for Troponin T (Highly Sensitive) as 7 and Dr Kirtland Bouchard said it was negative. She has had higher blood pressure readings at home but on Tuesday she had a tiny bit of chest pain. She mentioned that her chest pain the past wasn't heart related so she is wondering can she still have her procedure on 5-1?

## 2023-03-19 NOTE — Telephone Encounter (Signed)
Should start amlodipine and take it on the day of the test as well Has a typical chest pains Reviewed last cardiology note I think she will be okay to proceed Will also copy Cathlyn Parsons to make sure RG  John, Please advise RG

## 2023-03-20 ENCOUNTER — Telehealth: Payer: Self-pay

## 2023-03-20 NOTE — Telephone Encounter (Signed)
Refer to phone note 03/20/23. CMA is currently working on obtaining clearance from cardio.

## 2023-03-20 NOTE — Telephone Encounter (Addendum)
Patient was cleared to have procedure from cardiology standpoint. See TE 03-20-2023

## 2023-03-20 NOTE — Telephone Encounter (Signed)
Lets go ahead and work on cardiac clearance as suggested by Cathlyn Parsons RG

## 2023-03-20 NOTE — Telephone Encounter (Signed)
Troponin Results reviewed with pt as per Dr. Vanetta Shawl note.  Pt verbalized understanding and had no additional questions. Routed to PCP

## 2023-03-20 NOTE — Telephone Encounter (Signed)
   Name: Anne Lawrence  DOB: 11-16-1966  MRN: [id number]   Primary Cardiologist: Gypsy Balsam, MD  Chart reviewed as part of pre-operative protocol coverage. Patient was contacted 03/20/2023 in reference to pre-operative risk assessment for pending surgery as outlined below.  Anne Lawrence was last seen on 01/15/2023 by Dr. Mauri Brooklyn.  Since that day, Anne Lawrence has done well from a cardiac standpoint.  She recently had noted elevated blood pressure.  She was started on amlodipine 5 mg daily.  She had 1 episode of brief chest tightness that occurred last week, similar to prior chest tightness.  Of note, coronary CT angiogram in 07/2022 showed coronary calcium score of 0, no evidence of CAD.  She denies any other new symptoms or concerns.  She is able to complete greater than 4 METS without difficulty.  Therefore, based on ACC/AHA guidelines, the patient would be at acceptable risk for the planned procedure without further cardiovascular testing.   The patient was advised that if she develops new symptoms prior to surgery to contact our office to arrange for a follow-up visit, and she verbalized understanding.  I will route this recommendation to the requesting party via Epic fax function and remove from pre-op pool. Please call with questions.  Joylene Grapes, NP 03/20/2023, 12:03 PM

## 2023-03-20 NOTE — Telephone Encounter (Signed)
[facility] Pre-operative Risk Assessment     Request for surgical clearance:     Endoscopy Procedure  What type of surgery is being performed?     EGD/Colon  When is this surgery scheduled?     03-25-2023  What type of clearance is required ?   Medical  Are there any medications that need to be held prior to surgery and how long? No meds. Just need to know if patient can proceed with her procedures  Practice name and name of physician performing surgery?      [facility]  What is your office phone and fax number?      Phone- [phone]  Fax- [phone]  Anesthesia type (None, local, MAC, general) ?       MAC

## 2023-03-22 NOTE — Telephone Encounter (Signed)
Awesome  Good job RG

## 2023-03-23 NOTE — Telephone Encounter (Signed)
Patient stated that she has been on the medication and her blood pressure seems to be lower than when she wasn't on the medication and she is good to go for the procedure and voiced understanding

## 2023-03-25 ENCOUNTER — Ambulatory Visit (AMBULATORY_SURGERY_CENTER): Payer: Managed Care, Other (non HMO) | Admitting: Gastroenterology

## 2023-03-25 ENCOUNTER — Encounter: Payer: Self-pay | Admitting: Gastroenterology

## 2023-03-25 VITALS — BP 153/70 | HR 67 | Temp 98.4°F | Resp 10 | Ht 61.0 in | Wt 264.0 lb

## 2023-03-25 DIAGNOSIS — D123 Benign neoplasm of transverse colon: Secondary | ICD-10-CM | POA: Diagnosis not present

## 2023-03-25 DIAGNOSIS — Z1211 Encounter for screening for malignant neoplasm of colon: Secondary | ICD-10-CM | POA: Diagnosis not present

## 2023-03-25 DIAGNOSIS — K2950 Unspecified chronic gastritis without bleeding: Secondary | ICD-10-CM | POA: Diagnosis not present

## 2023-03-25 DIAGNOSIS — R1310 Dysphagia, unspecified: Secondary | ICD-10-CM

## 2023-03-25 DIAGNOSIS — Z800 Family history of malignant neoplasm of digestive organs: Secondary | ICD-10-CM | POA: Diagnosis not present

## 2023-03-25 DIAGNOSIS — K5140 Inflammatory polyps of colon without complications: Secondary | ICD-10-CM | POA: Diagnosis not present

## 2023-03-25 DIAGNOSIS — K317 Polyp of stomach and duodenum: Secondary | ICD-10-CM

## 2023-03-25 DIAGNOSIS — K219 Gastro-esophageal reflux disease without esophagitis: Secondary | ICD-10-CM

## 2023-03-25 MED ORDER — SODIUM CHLORIDE 0.9 % IV SOLN
500.0000 mL | Freq: Once | INTRAVENOUS | Status: DC
Start: 2023-03-25 — End: 2023-03-25

## 2023-03-25 NOTE — Patient Instructions (Addendum)
High fiber diet. Continue present medications. No nonsteroidals for 5 days.  NO ASPIRIN, MOTRIN, ADVIL, IBUPROFEN.  TYLENOL IS OK!! Await pathology results. Repeat colonoscopy for surveillance based on pathology results Continue present medications including Nexium 40 mg p.o. daily.      Follow dilation diet today!!!  See handout!!                                              .  YOU HAD AN ENDOSCOPIC PROCEDURE TODAY AT THE [facility]:   Refer to the procedure report that was given to you for any specific questions about what was found during the examination.  If the procedure report does not answer your questions, please call your gastroenterologist to clarify.  If you requested that your care partner not be given the details of your procedure findings, then the procedure report has been included in a sealed envelope for you to review at your convenience later.  YOU SHOULD EXPECT: Some feelings of bloating in the abdomen. Passage of more gas than usual.  Walking can help get rid of the air that was put into your GI tract during the procedure and reduce the bloating. If you had a lower endoscopy (such as a colonoscopy or flexible sigmoidoscopy) you may notice spotting of blood in your stool or on the toilet paper. If you underwent a bowel prep for your procedure, you may not have a normal bowel movement for a few days.  Please Note:  You might notice some irritation and congestion in your nose or some drainage.  This is from the oxygen used during your procedure.  There is no need for concern and it should clear up in a day or so.  SYMPTOMS TO REPORT IMMEDIATELY:  Following lower endoscopy (colonoscopy or flexible sigmoidoscopy):  Excessive amounts of blood in the stool  Significant tenderness or worsening of abdominal pains  Swelling of the abdomen that is new, acute  Fever of 100F or higher  Following upper endoscopy (EGD)  Vomiting of blood or coffee ground material  New  chest pain or pain under the shoulder blades  Painful or persistently difficult swallowing  New shortness of breath  Fever of 100F or higher  Black, tarry-looking stools  For urgent or emergent issues, a gastroenterologist can be reached at any hour by calling [phone]. Do not use MyChart messaging for urgent concerns.    DIET:  FOLLOW DILATION DIET!!!  Drink plenty of fluids but you should avoid alcoholic beverages for 24 hours.  ACTIVITY:  You should plan to take it easy for the rest of today and you should NOT DRIVE or use heavy machinery until tomorrow (because of the sedation medicines used during the test).    FOLLOW UP: Our staff will call the number listed on your records the next business day following your procedure.  We will call around 7:15- 8:00 am to check on you and address any questions or concerns that you may have regarding the information given to you following your procedure. If we do not reach you, we will leave a message.     If any biopsies were taken you will be contacted by phone or by letter within the next 1-3 weeks.  Please call us at [phone] if you have not heard about the biopsies in 3 weeks.    SIGNATURES/CONFIDENTIALITY: You  and/or your care partner have signed paperwork which will be entered into your electronic medical record.  These signatures attest to the fact that that the information above on your After Visit Summary has been reviewed and is understood.  Full responsibility of the confidentiality of this discharge information lies with you and/or your care-partner.
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
## 2023-03-25 NOTE — Op Note (Signed)
[facility] Patient Name: Anne Lawrence Procedure Date: 03/25/2023 11:13 AM MRN: [id number] Endoscopist: Lynann Bologna , MD, [id number] Age: 57 Referring MD:  Date of Birth: [birth year] Gender: Female Account #: [id number] Procedure:                Upper GI endoscopy Indications:              Dysphagia, GERD, atypical chest pains Medicines:                Monitored Anesthesia Care Procedure:                Pre-Anesthesia Assessment:                           - Prior to the procedure, a History and Physical                            was performed, and patient medications and                            allergies were reviewed. The patient's tolerance of                            previous anesthesia was also reviewed. The risks                            and benefits of the procedure and the sedation                            options and risks were discussed with the patient.                            All questions were answered, and informed consent                            was obtained. Prior Anticoagulants: The patient has                            taken no anticoagulant or antiplatelet agents. ASA                            Grade Assessment: II - A patient with mild systemic                            disease. After reviewing the risks and benefits,                            the patient was deemed in satisfactory condition to                            undergo the procedure.                           After obtaining informed consent, the endoscope was  passed under direct vision. Throughout the                            procedure, the patient's blood pressure, pulse, and                            oxygen saturations were monitored continuously. The                            GIF [id number] #[id number] was introduced through the                            mouth, and advanced to the second part of duodenum.                            The upper GI  endoscopy was accomplished without                            difficulty. The patient tolerated the procedure                            well. Scope In: Scope Out: Findings:                 The examined esophagus was normal with well-defined                            Z-line at 35 cm, examined by NBI. Biopsies were                            obtained from the proximal and distal esophagus                            with cold forceps for histology to r/o eosinophilic                            esophagitis. The scope was withdrawn. Dilation was                            performed with a Maloney dilator with mild                            resistance at 50 Fr.                           Localized mild inflammation characterized by                            erosions and 2 small inflammatory appearing polyps                            was found in the gastric antrum. Biopsies were                            taken with  a cold forceps for histology.                           A few (8-10) 2 to 4 mm sessile polyps with no                            stigmata of recent bleeding were found in the                            gastric body. Three polyps were removed with a cold                            biopsy forceps. Resection and retrieval were                            complete.                           The examined duodenum was normal. Complications:            No immediate complications. Estimated Blood Loss:     Estimated blood loss: none. Impression:               - Mild erosive gastritis                           - A few gastric polyps. Resected and retrieved x 3.                           - S/P esophageal dilatation Recommendation:           - Patient has a contact number available for                            emergencies. The signs and symptoms of potential                            delayed complications were discussed with the                            patient. Return to normal  activities tomorrow.                            Written discharge instructions were provided to the                            patient.                           - Post dilatation diet.                           - Continue present medications including Nexium 40                            mg p.o. daily.                           -  Avoid nonsteroidals.                           - Await pathology results.                           - Proceed with colonoscopy.                           - The findings and recommendations were discussed                            with the patient's family. Lynann Bologna, MD 03/25/2023 11:54:15 AM This report has been signed electronically.

## 2023-03-25 NOTE — Progress Notes (Signed)
Pt's states no medical or surgical changes since previsit or office visit. 

## 2023-03-25 NOTE — Progress Notes (Signed)
To pacu, vss. Report to [email or web address] 

## 2023-03-25 NOTE — Progress Notes (Signed)
Called to room to assist during endoscopic procedure.  Patient ID and intended procedure confirmed with present staff. Received instructions for my participation in the procedure from the performing physician.  

## 2023-03-25 NOTE — Op Note (Signed)
[facility] Patient Name: Anne Lawrence Procedure Date: 03/25/2023 11:13 AM MRN: [id number] Endoscopist: Lynann Bologna , MD, [id number] Age: 57 Referring MD:  Date of Birth: October 16, 1966 Gender: Female Account #: [id number] Procedure:                Colonoscopy Indications:              Screening in patient at increased risk: FH CRC                            (brother at age 73 but stage IV, maternal GM) Medicines:                Monitored Anesthesia Care Procedure:                Pre-Anesthesia Assessment:                           - Prior to the procedure, a History and Physical                            was performed, and patient medications and                            allergies were reviewed. The patient's tolerance of                            previous anesthesia was also reviewed. The risks                            and benefits of the procedure and the sedation                            options and risks were discussed with the patient.                            All questions were answered, and informed consent                            was obtained. Prior Anticoagulants: The patient has                            taken no anticoagulant or antiplatelet agents. ASA                            Grade Assessment: II - A patient with mild systemic                            disease. After reviewing the risks and benefits,                            the patient was deemed in satisfactory condition to                            undergo the procedure.  After obtaining informed consent, the colonoscope                            was passed under direct vision. Throughout the                            procedure, the patient's blood pressure, pulse, and                            oxygen saturations were monitored continuously. The                            Olympus CF-HQ190L [id number]) Colonoscope was                            introduced through  the anus and advanced to the 2                            cm into the ileum. The colonoscopy was performed                            without difficulty. The patient tolerated the                            procedure well. The quality of the bowel                            preparation was good. The terminal ileum, ileocecal                            valve, appendiceal orifice, and rectum were                            photographed. Scope In: 11:30:21 AM Scope Out: 11:49:52 AM Scope Withdrawal Time: 0 hours 15 minutes 48 seconds  Total Procedure Duration: 0 hours 19 minutes 31 seconds  Findings:                 A 10 mm polyp was found in the distal transverse                            colon. The polyp was semi-pedunculated. The polyp                            was removed with a hot snare. Resection and                            retrieval were complete with Lucina Mellow basket. To                            prevent bleeding after the polypectomy, one                            hemostatic clip was successfully placed (MR  conditional). Clip manufacturer: [employer].                            There was no bleeding at the end of the procedure.                            The area was tattooed with 1 cc spot.                           Multiple small-mouthed diverticula were found in                            the sigmoid colon. Few diverticula in the ascending                            colon and descending colon.                           The terminal ileum appeared normal.                           Non-bleeding internal hemorrhoids were found during                            retroflexion. The hemorrhoids were small and Grade                            I (internal hemorrhoids that do not prolapse).                           The exam was otherwise without abnormality on                            direct and retroflexion views. Complications:            No  immediate complications. Estimated Blood Loss:     Estimated blood loss: none. Impression:               - One 10 mm polyp in the distal transverse colon,                            removed with a hot snare. Resected and                            retrieved/tattooed. Clip (MR conditional) was                            placed. Clip manufacturer: [employer].                           - Moderate predominantly sigmoid diverticulosis.                           - The examined portion of the ileum was normal.                           -  Non-bleeding internal hemorrhoids.                           - The examination was otherwise normal on direct                            and retroflexion views. Recommendation:           - Patient has a contact number available for                            emergencies. The signs and symptoms of potential                            delayed complications were discussed with the                            patient. Return to normal activities tomorrow.                            Written discharge instructions were provided to the                            patient.                           - High fiber diet.                           - Continue present medications.                           - No nonsteroidals for 5 days.                           - Await pathology results.                           - Repeat colonoscopy for surveillance based on                            pathology results.                           - The findings and recommendations were discussed                            with the patient's family. Lynann Bologna, MD 03/25/2023 12:00:00 PM This report has been signed electronically.

## 2023-03-26 ENCOUNTER — Telehealth: Payer: Self-pay | Admitting: *Deleted

## 2023-03-26 ENCOUNTER — Telehealth: Payer: Self-pay | Admitting: Gastroenterology

## 2023-03-26 NOTE — Telephone Encounter (Signed)
Called patient back. She reports tenderness around bilat sides jaw line area and under chin. She is aware of possibly having sleep apnea and PCP recommended sleep study. Informed patient that the facial discomfort is most likely as a result of jaw lift performed during her endoscopy procedures due to the sleep apnea and to keep her airway open and properly oxygenated. Advised patient to use heat or cold and take tylenol. Patient  agreed to POC and will call back if symptoms don't improver in couple of days.

## 2023-03-26 NOTE — Telephone Encounter (Signed)
Thanks Jarmila Agree with above RG

## 2023-03-26 NOTE — Telephone Encounter (Signed)
  Follow up Call-     03/25/2023   10:32 AM  Call back number  Post procedure Call Back phone  # [phone]  Permission to leave phone message Yes     Patient questions:  No message left.  Phone kept ringing.

## 2023-03-26 NOTE — Telephone Encounter (Signed)
PT had colonoscopy/egd yesterday and has swelling in her face under the ear on both sides. The RT side is more tender. She is very concerned. Please advise.

## 2023-03-30 ENCOUNTER — Encounter: Payer: Self-pay | Admitting: Gastroenterology

## 2023-04-13 ENCOUNTER — Ambulatory Visit: Payer: 59 | Admitting: Oncology

## 2023-04-13 ENCOUNTER — Other Ambulatory Visit: Payer: 59
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
## 2023-04-16 ENCOUNTER — Other Ambulatory Visit: Payer: Self-pay | Admitting: Oncology

## 2023-04-16 ENCOUNTER — Ambulatory Visit: Payer: 59

## 2023-04-16 ENCOUNTER — Encounter: Payer: Self-pay | Admitting: Oncology

## 2023-04-16 ENCOUNTER — Inpatient Hospital Stay (INDEPENDENT_AMBULATORY_CARE_PROVIDER_SITE_OTHER): Payer: Managed Care, Other (non HMO) | Admitting: Oncology

## 2023-04-16 ENCOUNTER — Inpatient Hospital Stay: Payer: Managed Care, Other (non HMO) | Attending: Oncology

## 2023-04-16 VITALS — BP 137/81 | HR 70 | Temp 98.2°F | Resp 18 | Ht 61.0 in | Wt 258.5 lb

## 2023-04-16 DIAGNOSIS — Z170 Estrogen receptor positive status [ER+]: Secondary | ICD-10-CM

## 2023-04-16 DIAGNOSIS — Z780 Asymptomatic menopausal state: Secondary | ICD-10-CM | POA: Diagnosis not present

## 2023-04-16 DIAGNOSIS — C50812 Malignant neoplasm of overlapping sites of left female breast: Secondary | ICD-10-CM | POA: Diagnosis not present

## 2023-04-16 DIAGNOSIS — M8580 Other specified disorders of bone density and structure, unspecified site: Secondary | ICD-10-CM | POA: Diagnosis present

## 2023-04-16 LAB — CMP (CANCER CENTER ONLY)
ALT: 32 U/L (ref 0–44)
AST: 25 U/L (ref 15–41)
Albumin: 3.9 g/dL (ref 3.5–5.0)
Alkaline Phosphatase: 62 U/L (ref 38–126)
Anion gap: 8 (ref 5–15)
BUN: 16 mg/dL (ref 6–20)
CO2: 26 mmol/L (ref 22–32)
Calcium: 8.8 mg/dL — ABNORMAL LOW (ref 8.9–10.3)
Chloride: 105 mmol/L (ref 98–111)
Creatinine: 0.65 mg/dL (ref 0.44–1.00)
GFR, Estimated: >60 mL/min (ref >60)
Glucose, Bld: 125 mg/dL — ABNORMAL HIGH (ref 70–99)
Potassium: 3.8 mmol/L (ref 3.5–5.1)
Sodium: 139 mmol/L (ref 135–145)
Total Bilirubin: 0.7 mg/dL (ref 0.3–1.2)
Total Protein: 7.6 g/dL (ref 6.5–8.1)

## 2023-04-16 LAB — CBC (CANCER CENTER ONLY)
HCT: 39.9 % (ref 36.0–46.0)
Hemoglobin: 12.4 g/dL (ref 12.0–15.0)
MCH: 25.5 pg — ABNORMAL LOW (ref 26.0–34.0)
MCHC: 31.1 g/dL (ref 30.0–36.0)
MCV: 82.1 fL (ref 80.0–100.0)
Platelet Count: 240 10*3/uL (ref 150–400)
RBC: 4.86 MIL/uL (ref 3.87–5.11)
RDW: 13.6 % (ref 11.5–15.5)
WBC Count: 8.1 10*3/uL (ref 4.0–10.5)
nRBC: 0 % (ref 0.0–0.2)

## 2023-04-23 ENCOUNTER — Inpatient Hospital Stay: Payer: Managed Care, Other (non HMO)

## 2023-04-23 VITALS — BP 123/68 | HR 77 | Resp 20 | Ht 61.0 in | Wt 261.0 lb

## 2023-04-23 DIAGNOSIS — M8580 Other specified disorders of bone density and structure, unspecified site: Secondary | ICD-10-CM

## 2023-04-23 DIAGNOSIS — Z170 Estrogen receptor positive status [ER+]: Secondary | ICD-10-CM

## 2023-04-23 MED ORDER — DENOSUMAB 60 MG/ML ~~LOC~~ SOSY
60.0000 mg | PREFILLED_SYRINGE | Freq: Once | SUBCUTANEOUS | Status: AC
  Administered 2023-04-23: 60 mg via SUBCUTANEOUS
  Filled 2023-04-23: qty 1

## 2023-04-23 NOTE — Patient Instructions (Signed)
Denosumab Injection (Oncology) What is this medication? DENOSUMAB (den oh SUE mab) prevents weakened bones caused by cancer. It may also be used to treat noncancerous bone tumors that cannot be removed by surgery. It can also be used to treat high calcium levels in the blood caused by cancer. It works by blocking a protein that causes bones to break down quickly. This slows down the release of calcium from bones, which lowers calcium levels in your blood. It also makes your bones stronger and less likely to break (fracture). This medicine may be used for other purposes; ask your health care provider or pharmacist if you have questions. COMMON BRAND NAME(S): XGEVA What should I tell my care team before I take this medication? They need to know if you have any of these conditions: Dental disease Having surgery or tooth extraction Infection Kidney disease Low levels of calcium or vitamin D in the blood Malnutrition On hemodialysis Skin conditions or sensitivity Thyroid or parathyroid disease An unusual reaction to denosumab, other medications, foods, dyes, or preservatives Pregnant or trying to get pregnant Breast-feeding How should I use this medication? This medication is for injection under the skin. It is given by your care team in a hospital or clinic setting. A special MedGuide will be given to you before each treatment. Be sure to read this information carefully each time. Talk to your care team about the use of this medication in children. While it may be prescribed for children as young as 13 years for selected conditions, precautions do apply. Overdosage: If you think you have taken too much of this medicine contact a poison control center or emergency room at once. NOTE: This medicine is only for you. Do not share this medicine with others. What if I miss a dose? Keep appointments for follow-up doses. It is important not to miss your dose. Call your care team if you are unable to  keep an appointment. What may interact with this medication? Do not take this medication with any of the following: Other medications containing denosumab This medication may also interact with the following: Medications that lower your chance of fighting infection Steroid medications, such as prednisone or cortisone This list may not describe all possible interactions. Give your health care provider a list of all the medicines, herbs, non-prescription drugs, or dietary supplements you use. Also tell them if you smoke, drink alcohol, or use illegal drugs. Some items may interact with your medicine. What should I watch for while using this medication? Your condition will be monitored carefully while you are receiving this medication. You may need blood work while taking this medication. This medication may increase your risk of getting an infection. Call your care team for advice if you get a fever, chills, sore throat, or other symptoms of a cold or flu. Do not treat yourself. Try to avoid being around people who are sick. You should make sure you get enough calcium and vitamin D while you are taking this medication, unless your care team tells you not to. Discuss the foods you eat and the vitamins you take with your care team. Some people who take this medication have severe bone, joint, or muscle pain. This medication may also increase your risk for jaw problems or a broken thigh bone. Tell your care team right away if you have severe pain in your jaw, bones, joints, or muscles. Tell your care team if you have any pain that does not go away or that gets worse. Talk   to your care team if you may be pregnant. Serious birth defects can occur if you take this medication during pregnancy and for 5 months after the last dose. You will need a negative pregnancy test before starting this medication. Contraception is recommended while taking this medication and for 5 months after the last dose. Your care team  can help you find the option that works for you. What side effects may I notice from receiving this medication? Side effects that you should report to your care team as soon as possible: Allergic reactions--skin rash, itching, hives, swelling of the face, lips, tongue, or throat Bone, joint, or muscle pain Low calcium level--muscle pain or cramps, confusion, tingling, or numbness in the hands or feet Osteonecrosis of the jaw--pain, swelling, or redness in the mouth, numbness of the jaw, poor healing after dental work, unusual discharge from the mouth, visible bones in the mouth Side effects that usually do not require medical attention (report to your care team if they continue or are bothersome): Cough Diarrhea Fatigue Headache Nausea This list may not describe all possible side effects. Call your doctor for medical advice about side effects. You may report side effects to FDA at 1-800-FDA-1088. Where should I keep my medication? This medication is given in a hospital or clinic. It will not be stored at home. NOTE: This sheet is a summary. It may not cover all possible information. If you have questions about this medicine, talk to your doctor, pharmacist, or health care provider.  2024 [employer] Standard (2022-04-02 00:00:00)  

## 2023-04-24 ENCOUNTER — Telehealth: Payer: Self-pay

## 2023-04-24 NOTE — Telephone Encounter (Signed)
Patient notified of lab results

## 2023-04-24 NOTE — Telephone Encounter (Signed)
-----   Message from Dellia Beckwith, MD sent at 04/22/2023  8:26 PM EDT ----- Regarding: call Tell her labs look good incl BS 125, but calcium a little low, rec increase to 2 daily

## 2023-05-03 ENCOUNTER — Encounter: Payer: Self-pay | Admitting: Oncology

## 2023-06-28 ENCOUNTER — Other Ambulatory Visit: Payer: Self-pay | Admitting: Gastroenterology

## 2023-07-15 ENCOUNTER — Telehealth: Payer: Self-pay

## 2023-07-15 NOTE — Telephone Encounter (Signed)
Printed and place in Dr. Remi Deter box for her review

## 2023-07-15 NOTE — Telephone Encounter (Signed)
-----   Message from Dellia Beckwith sent at 07/15/2023  7:10 AM EDT ----- I have not seen DEXA and don't see it in EPIC, pls print from [employer] ----- Message ----- From: Lianne Bushy, LPN Sent: 2/95/2841   4:33 PM EDT To: Dellia Beckwith, MD; Jeannette Corpus, LPN  Patient wanted results of bone density scan and wondering if she needed to continue with prolia injections.

## 2023-07-16 ENCOUNTER — Telehealth: Payer: Self-pay

## 2023-07-16 NOTE — Telephone Encounter (Signed)
Patient states she stopped the Anastrazole last year at this time. Either in August or September in 2023. Patient also was notified of the message

## 2023-07-16 NOTE — Telephone Encounter (Signed)
-----   Message from Dellia Beckwith sent at 07/15/2023  5:48 PM EDT ----- Her DEXA is stable to sl improved so we can stop the Prolia.  She will also be due to stop the anastrazole in sept.  Keep appt and labs 12/5 but won't need the infusion appt. ----- Message ----- From: Lianne Bushy, LPN Sent: 7/82/9562   4:33 PM EDT To: Dellia Beckwith, MD; Jeannette Corpus, LPN  Patient wanted results of bone density scan and wondering if she needed to continue with prolia injections.

## 2023-07-22 ENCOUNTER — Encounter: Payer: Self-pay | Admitting: Hematology and Oncology

## 2023-10-29 ENCOUNTER — Other Ambulatory Visit: Payer: Managed Care, Other (non HMO)

## 2023-10-29 ENCOUNTER — Ambulatory Visit: Payer: Managed Care, Other (non HMO) | Admitting: Oncology

## 2023-11-05 ENCOUNTER — Ambulatory Visit: Payer: Managed Care, Other (non HMO)

## 2023-11-26 ENCOUNTER — Ambulatory Visit: Payer: Managed Care, Other (non HMO) | Admitting: Hematology and Oncology

## 2023-11-26 ENCOUNTER — Inpatient Hospital Stay: Payer: Managed Care, Other (non HMO)

## 2023-12-08 ENCOUNTER — Other Ambulatory Visit: Payer: Self-pay | Admitting: Oncology

## 2023-12-08 DIAGNOSIS — Z853 Personal history of malignant neoplasm of breast: Secondary | ICD-10-CM

## 2023-12-09 ENCOUNTER — Inpatient Hospital Stay: Payer: Managed Care, Other (non HMO) | Attending: Oncology

## 2023-12-09 ENCOUNTER — Telehealth: Payer: Self-pay | Admitting: Oncology

## 2023-12-09 ENCOUNTER — Other Ambulatory Visit: Payer: Self-pay | Admitting: Oncology

## 2023-12-09 ENCOUNTER — Inpatient Hospital Stay (HOSPITAL_BASED_OUTPATIENT_CLINIC_OR_DEPARTMENT_OTHER): Payer: Managed Care, Other (non HMO) | Admitting: Oncology

## 2023-12-09 ENCOUNTER — Encounter: Payer: Self-pay | Admitting: Oncology

## 2023-12-09 VITALS — BP 136/81 | HR 82 | Temp 98.3°F | Resp 18 | Ht 61.0 in | Wt 259.1 lb

## 2023-12-09 DIAGNOSIS — I890 Lymphedema, not elsewhere classified: Secondary | ICD-10-CM | POA: Diagnosis not present

## 2023-12-09 DIAGNOSIS — Z801 Family history of malignant neoplasm of trachea, bronchus and lung: Secondary | ICD-10-CM | POA: Insufficient documentation

## 2023-12-09 DIAGNOSIS — Z803 Family history of malignant neoplasm of breast: Secondary | ICD-10-CM | POA: Insufficient documentation

## 2023-12-09 DIAGNOSIS — M8580 Other specified disorders of bone density and structure, unspecified site: Secondary | ICD-10-CM | POA: Diagnosis not present

## 2023-12-09 DIAGNOSIS — C50812 Malignant neoplasm of overlapping sites of left female breast: Secondary | ICD-10-CM | POA: Diagnosis not present

## 2023-12-09 DIAGNOSIS — Z800 Family history of malignant neoplasm of digestive organs: Secondary | ICD-10-CM | POA: Diagnosis not present

## 2023-12-09 DIAGNOSIS — Z170 Estrogen receptor positive status [ER+]: Secondary | ICD-10-CM

## 2023-12-09 DIAGNOSIS — D519 Vitamin B12 deficiency anemia, unspecified: Secondary | ICD-10-CM | POA: Insufficient documentation

## 2023-12-09 DIAGNOSIS — F419 Anxiety disorder, unspecified: Secondary | ICD-10-CM | POA: Diagnosis not present

## 2023-12-09 DIAGNOSIS — Z780 Asymptomatic menopausal state: Secondary | ICD-10-CM

## 2023-12-09 DIAGNOSIS — Z79899 Other long term (current) drug therapy: Secondary | ICD-10-CM | POA: Diagnosis not present

## 2023-12-09 DIAGNOSIS — R0789 Other chest pain: Secondary | ICD-10-CM

## 2023-12-09 DIAGNOSIS — Z853 Personal history of malignant neoplasm of breast: Secondary | ICD-10-CM | POA: Insufficient documentation

## 2023-12-09 LAB — CMP (CANCER CENTER ONLY)
ALT: 34 U/L (ref 0–44)
AST: 33 U/L (ref 15–41)
Albumin: 4.3 g/dL (ref 3.5–5.0)
Alkaline Phosphatase: 92 U/L (ref 38–126)
Anion gap: 11 (ref 5–15)
BUN: 12 mg/dL (ref 6–20)
CO2: 28 mmol/L (ref 22–32)
Calcium: 9.5 mg/dL (ref 8.9–10.3)
Chloride: 101 mmol/L (ref 98–111)
Creatinine: 0.73 mg/dL (ref 0.44–1.00)
GFR, Estimated: >60 mL/min (ref >60)
Glucose, Bld: 163 mg/dL — ABNORMAL HIGH (ref 70–99)
Potassium: 3.9 mmol/L (ref 3.5–5.1)
Sodium: 140 mmol/L (ref 135–145)
Total Bilirubin: 0.3 mg/dL (ref 0.0–1.2)
Total Protein: 7.3 g/dL (ref 6.5–8.1)

## 2023-12-09 LAB — CBC WITH DIFFERENTIAL (CANCER CENTER ONLY)
Abs Immature Granulocytes: 0.03 10*3/uL (ref 0.00–0.07)
Basophils Absolute: 0 10*3/uL (ref 0.0–0.1)
Basophils Relative: 0 %
Eosinophils Absolute: 0.2 10*3/uL (ref 0.0–0.5)
Eosinophils Relative: 2 %
HCT: 38.8 % (ref 36.0–46.0)
Hemoglobin: 12.9 g/dL (ref 12.0–15.0)
Immature Granulocytes: 0 %
Lymphocytes Relative: 28 %
Lymphs Abs: 2.6 10*3/uL (ref 0.7–4.0)
MCH: 26.3 pg (ref 26.0–34.0)
MCHC: 33.2 g/dL (ref 30.0–36.0)
MCV: 79.2 fL — ABNORMAL LOW (ref 80.0–100.0)
Monocytes Absolute: 0.6 10*3/uL (ref 0.1–1.0)
Monocytes Relative: 6 %
Neutro Abs: 6.1 10*3/uL (ref 1.7–7.7)
Neutrophils Relative %: 64 %
Platelet Count: 232 10*3/uL (ref 150–400)
RBC: 4.9 MIL/uL (ref 3.87–5.11)
RDW: 13.3 % (ref 11.5–15.5)
WBC Count: 9.5 10*3/uL (ref 4.0–10.5)
nRBC: 0 % (ref 0.0–0.2)
nRBC: 0 /100{WBCs}

## 2023-12-09 NOTE — Telephone Encounter (Signed)
 12/09/23 Spoke with patient and confirmed next appt
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
## 2023-12-09 NOTE — Addendum Note (Signed)
 Addended by: Maude Hettich M on: 12/09/2023 04:05 PM   Modules accepted: Orders

## 2023-12-13 ENCOUNTER — Encounter: Payer: Self-pay | Admitting: Oncology

## 2023-12-15 ENCOUNTER — Telehealth: Payer: Self-pay

## 2023-12-15 NOTE — Telephone Encounter (Signed)
-----   Message from Dellia Beckwith sent at 12/13/2023  6:50 PM EST ----- Regarding: CXR She will have a CXR with rib details, here or [facility], pls look for it

## 2023-12-15 NOTE — Telephone Encounter (Signed)
Patient is going tomorrow 12/16/23.

## 2023-12-16 ENCOUNTER — Ambulatory Visit (INDEPENDENT_AMBULATORY_CARE_PROVIDER_SITE_OTHER)
Admission: RE | Admit: 2023-12-16 | Discharge: 2023-12-16 | Disposition: A | Discharge status: Home / Self Care | Payer: Managed Care, Other (non HMO) | Source: Ambulatory Visit | Attending: Oncology | Admitting: Oncology

## 2023-12-16 DIAGNOSIS — R0789 Other chest pain: Secondary | ICD-10-CM | POA: Diagnosis not present

## 2023-12-18 ENCOUNTER — Telehealth: Payer: Self-pay

## 2023-12-18 NOTE — Telephone Encounter (Signed)
-----  Message from Dellia Beckwith sent at 12/13/2023  6:50 PM EST ----- Regarding: CXR She will have a CXR with rib details, here or [facility], pls look for it

## 2023-12-18 NOTE — Telephone Encounter (Signed)
Had CXR on 12/16/23 at [facility] Radiology department. No final report as of 12/18/23.

## 2023-12-23 ENCOUNTER — Encounter: Payer: Self-pay | Admitting: Oncology

## 2023-12-23 ENCOUNTER — Telehealth: Payer: Self-pay

## 2023-12-23 NOTE — Telephone Encounter (Signed)
Attempted to contact patient. No answer.

## 2023-12-23 NOTE — Telephone Encounter (Signed)
-----   Message from Dellia Beckwith sent at 12/23/2023 10:46 AM EST ----- Regarding: call Tell her CXR and ribs are all clear

## 2023-12-24 ENCOUNTER — Telehealth: Payer: Self-pay

## 2023-12-24 NOTE — Telephone Encounter (Signed)
Attempted to contact patient. No answer.

## 2023-12-24 NOTE — Telephone Encounter (Signed)
-----   Message from Dellia Beckwith sent at 12/23/2023 10:46 AM EST ----- Regarding: call Tell her CXR and ribs are all clear

## 2023-12-25 ENCOUNTER — Telehealth: Payer: Self-pay

## 2023-12-25 NOTE — Telephone Encounter (Signed)
-----   Message from Dellia Beckwith sent at 12/23/2023 10:46 AM EST ----- Regarding: call Tell her CXR and ribs are all clear

## 2023-12-25 NOTE — Telephone Encounter (Signed)
Patient returned my call. Patient notified of message.

## 2024-01-21 NOTE — Progress Notes (Unsigned)
 Cardiology Office Note:  .   Date:  01/22/2024  ID:  Anne Lawrence, DOB 1966-10-26, MRN [id number] PCP: Anne Pen, MD  [facility] Providers Cardiologist:  Anne Balsam, MD    History of Present Illness: .   Anne Lawrence is Lawrence 58 y.o. female with Lawrence past medical history of GERD, DM2, hypertension, dyslipidemia, obesity, abnormal stress evaluation, history of breast cancer s/p lumpectomy and radiation.  08/14/2022 coronary CTA calcium score of 0 10/26/2018 echo EF 55 to 60%, mild LVH  Most recently evaluated by Dr. Bing Lawrence on 01/15/2023, was stable from Lawrence cardiac perspective, had occasional palpitations but was not overall bothered by them, advised to follow-up in 1 year.  She presents today for follow-up of her hypertension and dyslipidemia.  She has been doing well since she was last evaluated in our office.  On occasion she is bothered by palpitations however are typically associated when she is under Lawrence lot of pressure.  She is planning to see her PCP in the next few weeks to get her medications renewed, also to be evaluated for sleep apnea.  She does not participate in any formal exercise but does try and stay active around her home. She denies chest pain, palpitations, dyspnea, pnd, orthopnea, n, v, dizziness, syncope, edema, weight gain, or early satiety.   ROS: Review of Systems  All other systems reviewed and are negative.    Studies Reviewed: Marland Kitchen   EKG Interpretation Date/Time:  Friday January 22 2024 09:22:57 EST Ventricular Rate:  67 PR Interval:  138 QRS Duration:  74 QT Interval:  394 QTC Calculation: 416 R Axis:   26  Text Interpretation: Normal sinus rhythm Low voltage QRS When compared with ECG of 14-Jul-2022 09:51, No significant change was found Confirmed by Anne Lawrence [phone]) on 01/22/2024 9:54:18 AM    Cardiac Studies & Procedures    ______________________________________________________________________________________________   STRESS TESTS  MYOCARDIAL PERFUSION IMAGING 10/06/2018  Narrative  Nuclear stress EF: 62%.  Blood pressure demonstrated Lawrence hypertensive response to exercise.  Horizontal ST segment depression ST segment depression of 1 mm was noted during stress in the III, aVF, V3, V4, V5 and V6 leads, beginning at 1 minutes of stress, and returning to baseline after 1-5 minutes of recovery.  Defect 1: There is Lawrence medium defect of moderate severity present in the basal anterior, basal anterolateral, mid anterior, mid anterolateral and apical anterior location.  This is an intermediate risk study.  The left ventricular ejection fraction is normal (55-65%).  Fixed defect involving anterior and antero - lateral wall that is most likely related to brest attenuation. There is minimal reversibility of that defect indicating possibility of mild ischemia.   ECHOCARDIOGRAM  ECHOCARDIOGRAM COMPLETE 10/26/2018  Narrative *[facility]* [address] [place], [place] [address] [phone]  ------------------------------------------------------------------- Transthoracic Echocardiography  Patient:    Anne Lawrence MR #:       [id number] Study Date: 10/26/2018 Gender:     F Age:        52 Height:     157.5 cm Weight:     121.1 kg BSA:        2.38 m^2 Pt. Status: Room:  ATTENDING    Anne Balsam, MD ORDERING     Anne Balsam, MD REFERRING    Anne Lawrence SONOGRAPHER  Anne Lawrence, RDCS PERFORMING   [facility], Anne Lawrence  cc:  ------------------------------------------------------------------- LV EF: 55% -   60%  ------------------------------------------------------------------- Indications:  Chest pain 786.51.  ------------------------------------------------------------------- History:   PMH:  Morbid obesity.  Palpitations and tachycardia. Risk factors:  Diabetes  mellitus.  ------------------------------------------------------------------- Study Conclusions  - Left ventricle: The cavity size was normal. Wall thickness was increased in Lawrence pattern of mild LVH. Systolic function was normal. The estimated ejection fraction was in the range of 55% to 60%. Regional wall motion abnormalities cannot be excluded. Left ventricular diastolic function parameters were normal for the patient&'s age.  ------------------------------------------------------------------- Study data:  No prior study was available for comparison.  Study status:  Routine.  Procedure:  Transthoracic echocardiography. Image quality was adequate.  Study completion:  There were no complications.          Transthoracic echocardiography.  M-mode, complete 2D, spectral Doppler, and color Doppler.  Birthdate: Patient birthdate: 1966/04/17.  Age:  Patient is 58 yr old.  Sex: Gender: female.    BMI: 48.8 kg/m^2.  Blood pressure:     130/60 Patient status:  Outpatient.  Study date:  Study date: 10/26/2018. Study time: 02:55 PM.  Location:  Echo laboratory.  -------------------------------------------------------------------  ------------------------------------------------------------------- Left ventricle:  The cavity size was normal. Wall thickness was increased in Lawrence pattern of mild LVH. Systolic function was normal. The estimated ejection fraction was in the range of 55% to 60%. Regional wall motion abnormalities cannot be excluded. The transmitral flow pattern was normal. The deceleration time of the early transmitral flow velocity was normal. The pulmonary vein flow pattern was normal. The tissue Doppler parameters were normal. Left ventricular diastolic function parameters were normal for the patient&'s age. There was no evidence of elevated ventricular filling pressure by Doppler parameters.  ------------------------------------------------------------------- Aortic valve:    Structurally normal valve. Trileaflet; normal thickness leaflets. Cusp separation was normal. Mobility was not restricted.  Doppler:  Transvalvular velocity was within the normal range. There was no stenosis. There was no regurgitation.  ------------------------------------------------------------------- Aorta:  The aorta was normal, not dilated, and non-diseased. Aortic root: The aortic root was normal in size.  ------------------------------------------------------------------- Mitral valve:   Structurally normal valve.   Mobility was not restricted.  Doppler:  Transvalvular velocity was within the normal range. There was no evidence for stenosis. There was trivial regurgitation.    Valve area by pressure half-time: 3.06 cm^2. Indexed valve area by pressure half-time: 1.29 cm^2/m^2.    Peak gradient (D): 4 mm Hg.  ------------------------------------------------------------------- Left atrium:  The atrium was normal in size.  ------------------------------------------------------------------- Atrial septum:  The septum was normal.  ------------------------------------------------------------------- Pulmonary veins: Common pulmonary vein:  The Doppler velocity and flow profile were normal.  ------------------------------------------------------------------- Right ventricle:  The cavity size was normal. Wall thickness was normal. Systolic function was normal.  ------------------------------------------------------------------- Pulmonic valve:    The valve appears to be grossly normal. Doppler:  Transvalvular velocity was within the normal range. There was no evidence for stenosis.  ------------------------------------------------------------------- Tricuspid valve:   Structurally normal valve.    Doppler: Transvalvular velocity was within the normal range. There was mild regurgitation.  ------------------------------------------------------------------- Pulmonary artery:    The main pulmonary artery was normal-sized. Systolic pressure was within the normal range.  ------------------------------------------------------------------- Right atrium:  The atrium was normal in size.  ------------------------------------------------------------------- Pericardium:  The pericardium was normal in appearance. There was no pericardial effusion.  ------------------------------------------------------------------- Systemic veins: Inferior vena cava: Not visualized. The vessel was normal in size.  ------------------------------------------------------------------- Post procedure conclusions Ascending Aorta:  - The aorta was normal, not dilated, and non-diseased.  ------------------------------------------------------------------- Measurements  Left ventricle  Value          Reference LV ID, ED, PLAX chordal                48    mm       43 - 52 LV ID, ES, PLAX chordal                33    mm       23 - 38 LV fx shortening, PLAX chordal         31    %        >=29 LV PW thickness, ED                    11    mm       ---------- IVS/LV PW ratio, ED                    1              <=1.3 Stroke volume, 2D                      71    ml       ---------- Stroke volume/bsa, 2D                  30    ml/m^2   ---------- LV e&', lateral                         10.7  cm/s     ---------- LV E/e&', lateral                       9.91           ---------- LV e&', medial                          9.03  cm/s     ---------- LV E/e&', medial                        11.74          ---------- LV e&', average                         9.87  cm/s     ---------- LV E/e&', average                       10.75          ----------  Ventricular septum                     Value          Reference IVS thickness, ED                      11    mm       ----------  LVOT                                   Value          Reference LVOT ID, S  19     mm       ---------- LVOT area                              2.84  cm^2     ---------- LVOT peak velocity, S                  109   cm/s     ---------- LVOT mean velocity, S                  68.9  cm/s     ---------- LVOT VTI, S                            25.1  cm       ---------- LVOT peak gradient, S                  5     mm Hg    ----------  Aorta                                  Value          Reference Aortic root ID, ED                     29    mm       ---------- Ascending aorta ID, Lawrence-P, S             29    mm       ----------  Left atrium                            Value          Reference LA ID, Lawrence-P, ES                         42    mm       ---------- LA ID/bsa, Lawrence-P                         1.77  cm/m^2   <=2.2 LA volume, S                           54.1  ml       ---------- LA volume/bsa, S                       22.7  ml/m^2   ---------- LA volume, ES, 1-p A4C                 60.8  ml       ---------- LA volume/bsa, ES, 1-p A4C             25.6  ml/m^2   ---------- LA volume, ES, 1-p A2C                 46.5  ml       ---------- LA volume/bsa, ES, 1-p A2C             19.5  ml/m^2   ----------  Mitral valve  Value          Reference Mitral E-wave peak velocity            106   cm/s     ---------- Mitral Lawrence-wave peak velocity            85.4  cm/s     ---------- Mitral deceleration time       (H)     246   ms       150 - 230 Mitral pressure half-time              72    ms       ---------- Mitral peak gradient, D                4     mm Hg    ---------- Mitral E/Lawrence ratio, peak                 1.2            ---------- Mitral valve area, PHT, DP             3.06  cm^2     ---------- Mitral valve area/bsa, PHT, DP         1.29  cm^2/m^2 ----------  Pulmonary arteries                     Value          Reference PA pressure, S, DP                     24    mm Hg    <=30  Tricuspid valve                        Value          Reference Tricuspid regurg peak  velocity         227   cm/s     ---------- Tricuspid peak RV-RA gradient          21    mm Hg    ----------  Right atrium                           Value          Reference RA ID, S-I, ES, A4C            (H)     57.2  mm       34 - 49 RA area, ES, A4C                       16.1  cm^2     8.3 - 19.5 RA volume, ES, Lawrence/L                     36.1  ml       ---------- RA volume/bsa, ES, Lawrence/L                 15.2  ml/m^2   ----------  Systemic veins                         Value          Reference Estimated CVP  3     mm Hg    ----------  Right ventricle                        Value          Reference TAPSE                                  26.4  mm       ---------- RV pressure, S, DP                     24    mm Hg    <=30 RV s&', lateral, S                      11.6  cm/s     ----------  Legend: (L)  and  (H)  mark values outside specified reference range.  ------------------------------------------------------------------- Prepared and Electronically Authenticated by  Norman Herrlich, MD 2019-12-03T17:14:01      CT SCANS  CT CORONARY MORPH W/CTA COR W/SCORE 08/14/2022  Addendum 08/14/2022  5:14 PM ADDENDUM REPORT: 08/14/2022 17:11  EXAM: OVER-READ INTERPRETATION  CT CHEST  The following report is an over-read performed by radiologist Dr. Darlys Gales [facility] on 08/14/2022. This over-read does not include interpretation of cardiac or coronary anatomy or pathology. The coronary CTA interpretation by the cardiologist is attached.  COMPARISON:  CT 05/25/2020.  FINDINGS: Vascular: No significant extracardiac vascular findings.  Mediastinum/Nodes: No lymphadenopathy.  Lungs/Pleura: No focal airspace disease. No suspicious pulmonary nodules.  Upper Abdomen: No acute abnormality.  Musculoskeletal: No acute osseous abnormality. No suspicious osseous lesion.  IMPRESSION: No significant extracardiac findings in the  chest.   Electronically Signed By: Caprice Renshaw M.D. On: 08/14/2022 17:11  Narrative CLINICAL DATA:  CP  EXAM: Cardiac/Coronary  CTA  TECHNIQUE: The patient was scanned on Lawrence [employer].  FINDINGS: Lawrence 120 kV prospective scan was triggered in the descending thoracic aorta at 111 HU's. Axial non-contrast 3 mm slices were carried out through the heart. The data set was analyzed on Lawrence dedicated work station and scored using the Agatson method. Gantry rotation speed was 250 msecs and collimation was .6 mm. No beta blockade and 0.8 mg of sl NTG was given. The 3D data set was reconstructed in 5% intervals of the 67-82 % of the R-R cycle. Diastolic phases were analyzed on Lawrence dedicated work station using MPR, MIP and VRT modes. The patient received 80 cc of contrast.  Aorta:  Normal size.  No calcifications.  No dissection.  Aortic Valve:  Trileaflet.  No calcifications.  Coronary Arteries:  Normal coronary origin.  Right dominance.  RCA is Lawrence large dominant artery that gives rise to PDA and PLA. There is no plaque.  Left main is Lawrence large artery that gives rise to LAD and LCX arteries.  LAD is Lawrence large vessel that has no plaque. This artery gives rise to small D1 and small D2.  LCX is Lawrence non-dominant artery that gives rise to one large OM1 branch. There is no plaque.  Other findings:  Normal pulmonary vein drainage into the left atrium. Rt middle pulmonary vein noted (normal variant).  Normal left atrial appendage without Lawrence thrombus.  Normal size of the pulmonary artery.  IMPRESSION: 1. Coronary calcium score of 0. This was 0 percentile for age and sex matched  control.  2. Normal coronary origin with right dominance.  3. CAD-RADS 0. No evidence of CAD (0%). Consider non-atherosclerotic causes of chest pain.  Electronically Signed: By: Anne Lawrence M.D. On: 08/14/2022 16:49      ______________________________________________________________________________________________      Risk Assessment/Calculations:             Physical Exam:   VS:  BP 130/78   Pulse 67   Ht 5\' 1"  (1.549 m)   Wt 260 lb (117.9 kg)   LMP  (LMP Unknown) Comment: last injection in January - no more per Dr. Dwain Sarna  SpO2 98%   BMI 49.13 kg/m    Wt Readings from Last 3 Encounters:  01/22/24 260 lb (117.9 kg)  12/09/23 259 lb 1.6 oz (117.5 kg)  04/23/23 261 lb (118.4 kg)    GEN: Well nourished, well developed in no acute distress NECK: No JVD; No carotid bruits CARDIAC: RRR, no murmurs, rubs, gallops RESPIRATORY:  Clear to auscultation without rales, wheezing or rhonchi  ABDOMEN: Soft, non-tender, non-distended EXTREMITIES:  No edema; No deformity   ASSESSMENT AND PLAN: .   Atypical chest pain-sounds to be most consistent with GERD, she also notices it when she does not take her Nexium as prescribed.  Lawrence coronary CTA had previously been arranged which revealed Lawrence calcium score of 0.  Dyslipidemia-will repeat FLP and LFTs, not currently on any lipid-lowering medications.  Palpitations-not overly bothersome for her.  Snoring-suspected OSA, STOP-BANG score of 5.  She is planning to follow-up with her PCP about this as they were previously arranging this however she subsequently got sick and was not able to wear it.  I did offer to arrange for an at home sleep evaluation but she prefers to follow-up with her PCP.  She will let us know if she needs sleep evaluation arranged by [place].  HTN -blood pressure is 130/78 today, continue Norvasc 5 mg daily.       Dispo: FLP LFTs, follow-up in 1 year.  Signed, Flossie Dibble, NP

## 2024-01-22 ENCOUNTER — Other Ambulatory Visit: Payer: Self-pay | Admitting: *Deleted

## 2024-01-22 ENCOUNTER — Encounter: Payer: Self-pay | Admitting: Cardiology

## 2024-01-22 ENCOUNTER — Ambulatory Visit: Payer: Managed Care, Other (non HMO) | Attending: Cardiology | Admitting: Cardiology

## 2024-01-22 ENCOUNTER — Encounter: Payer: Self-pay | Admitting: *Deleted

## 2024-01-22 VITALS — BP 130/78 | HR 67 | Ht 61.0 in | Wt 260.0 lb

## 2024-01-22 DIAGNOSIS — E782 Mixed hyperlipidemia: Secondary | ICD-10-CM

## 2024-01-22 DIAGNOSIS — I10 Essential (primary) hypertension: Secondary | ICD-10-CM | POA: Diagnosis not present

## 2024-01-22 DIAGNOSIS — R9439 Abnormal result of other cardiovascular function study: Secondary | ICD-10-CM | POA: Diagnosis not present

## 2024-01-22 DIAGNOSIS — E785 Hyperlipidemia, unspecified: Secondary | ICD-10-CM | POA: Diagnosis not present

## 2024-01-22 DIAGNOSIS — R0789 Other chest pain: Secondary | ICD-10-CM

## 2024-01-22 DIAGNOSIS — R0683 Snoring: Secondary | ICD-10-CM

## 2024-01-22 MED ORDER — NITROGLYCERIN 0.4 MG SL SUBL: 0.4000 mg | SUBLINGUAL_TABLET | SUBLINGUAL | 3 refills | Status: AC | PRN

## 2024-01-22 NOTE — Patient Instructions (Addendum)
 Medication Instructions:  Your physician recommends that you continue on your current medications as directed. Please refer to the Current Medication list given to you today.  *If you need a refill on your cardiac medications before your next appointment, please call your pharmacy*   Lab Work: Your physician recommends that you have labs done in the office today. Your test included  complete metabolic panel,  and lipids.  If you have labs (blood work) drawn today and your tests are completely normal, you will receive your results only by: MyChart Message (if you have MyChart) OR A paper copy in the mail If you have any lab test that is abnormal or we need to change your treatment, we will call you to review the results.   Testing/Procedures: None Ordered   Follow-Up: At [facility], you and your health needs are our priority.  As part of our continuing mission to provide you with exceptional heart care, we have created designated Provider Care Teams.  These Care Teams include your primary Cardiologist (physician) and Advanced Practice Providers (APPs -  Physician Assistants and Nurse Practitioners) who all work together to provide you with the care you need, when you need it.  We recommend signing up for the patient portal called "MyChart".  Sign up information is provided on this After Visit Summary.  MyChart is used to connect with patients for Virtual Visits (Telemedicine).  Patients are able to view lab/test results, encounter notes, upcoming appointments, etc.  Non-urgent messages can be sent to your provider as well.   To learn more about what you can do with MyChart, go to [email or web address].    Your next appointment:

## 2024-01-23 LAB — COMPREHENSIVE METABOLIC PANEL WITH GFR
ALT: 31 IU/L (ref 0–32)
AST: 23 IU/L (ref 0–40)
Albumin: 4.1 g/dL (ref 3.8–4.9)
Alkaline Phosphatase: 97 IU/L (ref 44–121)
BUN/Creatinine Ratio: 29 — ABNORMAL HIGH (ref 9–23)
BUN: 14 mg/dL (ref 6–24)
Bilirubin Total: 0.2 mg/dL (ref 0.0–1.2)
CO2: 25 mmol/L (ref 20–29)
Calcium: 9 mg/dL (ref 8.7–10.2)
Chloride: 102 mmol/L (ref 96–106)
Creatinine, Ser: 0.49 mg/dL — ABNORMAL LOW (ref 0.57–1.00)
Globulin, Total: 2.6 g/dL (ref 1.5–4.5)
Glucose: 127 mg/dL — ABNORMAL HIGH (ref 70–99)
Potassium: 4.1 mmol/L (ref 3.5–5.2)
Sodium: 141 mmol/L (ref 134–144)
Total Protein: 6.7 g/dL (ref 6.0–8.5)
eGFR: 110 mL/min/1.73

## 2024-01-23 LAB — LIPID PANEL
Chol/HDL Ratio: 3.2 ratio (ref 0.0–4.4)
Cholesterol, Total: 131 mg/dL (ref 100–199)
HDL: 41 mg/dL
LDL Chol Calc (NIH): 75 mg/dL (ref 0–99)
Triglycerides: 75 mg/dL (ref 0–149)
VLDL Cholesterol Cal: 15 mg/dL (ref 5–40)

## 2024-02-02 ENCOUNTER — Other Ambulatory Visit: Payer: Self-pay

## 2024-02-02 MED ORDER — CHOLESTYRAMINE 4 G PO PACK: 4.0000 g | PACK | Freq: Every day | ORAL | 2 refills | Status: DC

## 2024-02-23 ENCOUNTER — Other Ambulatory Visit: Payer: Self-pay | Admitting: Cardiology

## 2024-02-23 NOTE — Telephone Encounter (Signed)
 Prescription sent to pharmacy.

## 2024-02-26 ENCOUNTER — Other Ambulatory Visit: Payer: Self-pay | Admitting: Oncology

## 2024-02-26 ENCOUNTER — Ambulatory Visit
Admission: RE | Admit: 2024-02-26 | Discharge: 2024-02-26 | Disposition: A | Discharge status: Home / Self Care | Payer: Managed Care, Other (non HMO) | Source: Ambulatory Visit | Attending: Oncology | Admitting: Oncology

## 2024-02-26 ENCOUNTER — Encounter: Payer: Self-pay | Admitting: Oncology

## 2024-02-26 ENCOUNTER — Ambulatory Visit
Admission: RE | Admit: 2024-02-26 | Discharge: 2024-02-26 | Disposition: A | Discharge status: Home / Self Care | Source: Ambulatory Visit | Attending: Oncology | Admitting: Oncology

## 2024-02-26 DIAGNOSIS — N6310 Unspecified lump in the right breast, unspecified quadrant: Secondary | ICD-10-CM

## 2024-02-26 DIAGNOSIS — N6489 Other specified disorders of breast: Secondary | ICD-10-CM

## 2024-02-26 DIAGNOSIS — Z853 Personal history of malignant neoplasm of breast: Secondary | ICD-10-CM

## 2024-03-18 ENCOUNTER — Other Ambulatory Visit: Payer: Self-pay

## 2024-03-18 MED ORDER — CHOLESTYRAMINE 4 G PO PACK: 4.0000 g | PACK | Freq: Every day | ORAL | 2 refills | Status: DC

## 2024-03-26 ENCOUNTER — Other Ambulatory Visit: Payer: Self-pay | Admitting: Gastroenterology

## 2024-04-15 ENCOUNTER — Telehealth: Payer: Self-pay | Admitting: Oncology

## 2024-04-15 NOTE — Telephone Encounter (Signed)
 Pt's appt was moved up to Oct 17th, 2025, she is aware of changes

## 2024-04-15 NOTE — Telephone Encounter (Signed)
-----   Message from Nolia Baumgartner sent at 04/14/2024  6:21 PM EDT ----- Regarding: appt So mammo from 4/4 is prob benign but they rec repeat right diag mammo and US  in 6 months.  It looks like it is already scheduled for 10/10?  So I should prob see her the following week to f/u on that

## 2024-06-06 ENCOUNTER — Ambulatory Visit (HOSPITAL_BASED_OUTPATIENT_CLINIC_OR_DEPARTMENT_OTHER)
Admission: EM | Admit: 2024-06-06 | Discharge: 2024-06-06 | Disposition: A | Discharge status: Home / Self Care | Attending: Nurse Practitioner | Admitting: Nurse Practitioner

## 2024-06-06 ENCOUNTER — Encounter (HOSPITAL_BASED_OUTPATIENT_CLINIC_OR_DEPARTMENT_OTHER): Payer: Self-pay

## 2024-06-06 DIAGNOSIS — R590 Localized enlarged lymph nodes: Secondary | ICD-10-CM | POA: Insufficient documentation

## 2024-06-06 DIAGNOSIS — S0086XA Insect bite (nonvenomous) of other part of head, initial encounter: Secondary | ICD-10-CM | POA: Diagnosis not present

## 2024-06-06 DIAGNOSIS — R519 Headache, unspecified: Secondary | ICD-10-CM | POA: Insufficient documentation

## 2024-06-06 DIAGNOSIS — Z79899 Other long term (current) drug therapy: Secondary | ICD-10-CM | POA: Insufficient documentation

## 2024-06-06 DIAGNOSIS — R110 Nausea: Secondary | ICD-10-CM | POA: Insufficient documentation

## 2024-06-06 DIAGNOSIS — R5383 Other fatigue: Secondary | ICD-10-CM | POA: Diagnosis not present

## 2024-06-06 DIAGNOSIS — R030 Elevated blood-pressure reading, without diagnosis of hypertension: Secondary | ICD-10-CM | POA: Diagnosis not present

## 2024-06-06 DIAGNOSIS — W57XXXA Bitten or stung by nonvenomous insect and other nonvenomous arthropods, initial encounter: Secondary | ICD-10-CM | POA: Diagnosis not present

## 2024-06-06 DIAGNOSIS — R300 Dysuria: Secondary | ICD-10-CM | POA: Diagnosis not present

## 2024-06-06 DIAGNOSIS — R200 Anesthesia of skin: Secondary | ICD-10-CM | POA: Diagnosis not present

## 2024-06-06 LAB — POCT URINALYSIS DIP (MANUAL ENTRY)
Bilirubin, UA: NEGATIVE
Blood, UA: NEGATIVE
Glucose, UA: NEGATIVE mg/dL
Ketones, POC UA: NEGATIVE mg/dL
Nitrite, UA: NEGATIVE
Protein Ur, POC: NEGATIVE mg/dL
Spec Grav, UA: 1.02 (ref 1.010–1.025)
Urobilinogen, UA: 0.2 U/dL
pH, UA: 5.5 (ref 5.0–8.0)

## 2024-06-06 MED ORDER — DOXYCYCLINE HYCLATE 100 MG PO CAPS: 100.0000 mg | ORAL_CAPSULE | Freq: Two times a day (BID) | ORAL | 0 refills | Status: AC

## 2024-06-06 NOTE — ED Triage Notes (Signed)
 Removed a tick from her forehead on June 30th. Has not felt well since. +swollen glands to right side of neck. Started on Doxycycline  but has not taken it as prescribed. Blood pressure has been running higher than usual so not sure if nausea and headaches she is now having is related to that. Left arm numbness this morning from top of shoulder with radiation down arm and to top of neck. Improved on arrival but still present.

## 2024-06-06 NOTE — Discharge Instructions (Addendum)
 I am not seeing anything concerning on your EKG.  We should have your blood work back by tomorrow.  I will call with any concerning results. Recommend taking the doxycycline  twice a day for at least 14 days Continue to monitor your blood pressure. If your symptoms do not resolve you will need to follow-up doctor for further evaluation
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
## 2024-06-07 ENCOUNTER — Telehealth: Payer: Self-pay

## 2024-06-07 LAB — CBC WITH DIFFERENTIAL/PLATELET
Basophils Absolute: 0 x10E3/uL (ref 0.0–0.2)
Basos: 0 %
EOS (ABSOLUTE): 0.1 x10E3/uL (ref 0.0–0.4)
Eos: 1 %
Hematocrit: 40.3 % (ref 34.0–46.6)
Hemoglobin: 12.6 g/dL (ref 11.1–15.9)
Immature Grans (Abs): 0.1 x10E3/uL (ref 0.0–0.1)
Immature Granulocytes: 1 %
Lymphocytes Absolute: 2.2 x10E3/uL (ref 0.7–3.1)
Lymphs: 23 %
MCH: 25.6 pg — ABNORMAL LOW (ref 26.6–33.0)
MCHC: 31.3 g/dL — ABNORMAL LOW (ref 31.5–35.7)
MCV: 82 fL (ref 79–97)
Monocytes Absolute: 0.4 x10E3/uL (ref 0.1–0.9)
Monocytes: 4 %
Neutrophils Absolute: 6.5 x10E3/uL (ref 1.4–7.0)
Neutrophils: 71 %
Platelets: 252 x10E3/uL (ref 150–450)
RBC: 4.93 x10E6/uL (ref 3.77–5.28)
RDW: 13.9 % (ref 11.7–15.4)
WBC: 9.3 x10E3/uL (ref 3.4–10.8)

## 2024-06-07 LAB — COMPREHENSIVE METABOLIC PANEL WITH GFR
ALT: 27 IU/L (ref 0–32)
AST: 24 IU/L (ref 0–40)
Albumin: 4.2 g/dL (ref 3.8–4.9)
Alkaline Phosphatase: 106 IU/L (ref 44–121)
BUN/Creatinine Ratio: 18 (ref 9–23)
BUN: 11 mg/dL (ref 6–24)
Bilirubin Total: <0.2 mg/dL (ref 0.0–1.2)
CO2: 22 mmol/L (ref 20–29)
Calcium: 9 mg/dL (ref 8.7–10.2)
Chloride: 104 mmol/L (ref 96–106)
Creatinine, Ser: 0.61 mg/dL (ref 0.57–1.00)
Globulin, Total: 2.9 g/dL (ref 1.5–4.5)
Glucose: 158 mg/dL — ABNORMAL HIGH (ref 70–99)
Potassium: 4.1 mmol/L (ref 3.5–5.2)
Sodium: 141 mmol/L (ref 134–144)
Total Protein: 7.1 g/dL (ref 6.0–8.5)
eGFR: 104 mL/min/1.73 (ref >59)

## 2024-06-07 LAB — LYME DISEASE SEROLOGY W/REFLEX: Lyme Total Antibody EIA: NEGATIVE

## 2024-06-07 NOTE — Telephone Encounter (Signed)
-----   Message from Wanda VEAR Cornish sent at 06/01/2024  6:56 PM EDT ----- Regarding: FW: call Her mammo looked good but we will still repeat in October to be sure ----- Message ----- From: Cornish Wanda VEAR, MD Sent: 04/15/2024   7:41 PM EDT To: Wanda VEAR Cornish, MD Subject: call                                           Call re Mammo

## 2024-06-07 NOTE — Telephone Encounter (Signed)
 Opened in error

## 2024-06-07 NOTE — Telephone Encounter (Signed)
 Attempted to contact patient. No answer.

## 2024-06-08 ENCOUNTER — Encounter: Payer: Self-pay | Admitting: Cardiology

## 2024-06-08 ENCOUNTER — Telehealth: Payer: Self-pay

## 2024-06-08 ENCOUNTER — Ambulatory Visit: Attending: Cardiology | Admitting: Cardiology

## 2024-06-08 VITALS — BP 134/78 | HR 90 | Ht 61.0 in | Wt 257.4 lb

## 2024-06-08 DIAGNOSIS — I10 Essential (primary) hypertension: Secondary | ICD-10-CM | POA: Diagnosis not present

## 2024-06-08 DIAGNOSIS — R0683 Snoring: Secondary | ICD-10-CM

## 2024-06-08 DIAGNOSIS — F419 Anxiety disorder, unspecified: Secondary | ICD-10-CM

## 2024-06-08 DIAGNOSIS — R000 Tachycardia, unspecified: Secondary | ICD-10-CM

## 2024-06-08 DIAGNOSIS — R0789 Other chest pain: Secondary | ICD-10-CM

## 2024-06-08 LAB — URINE CULTURE: Culture: 20000 — AB

## 2024-06-08 MED ORDER — METOPROLOL SUCCINATE ER 50 MG PO TB24: 50.0000 mg | ORAL_TABLET | Freq: Every day | ORAL | 3 refills | Status: AC

## 2024-06-08 NOTE — Patient Instructions (Signed)
 Medication Instructions:   INCREASE: Metoprolol  Succinate 50mg  1 tablet daily   Lab Work: None Ordered If you have labs (blood work) drawn today and your tests are completely normal, you will receive your results only by: MyChart Message (if you have MyChart) OR A paper copy in the mail If you have any lab test that is abnormal or we need to change your treatment, we will call you to review the results.   Testing/Procedures: [facility] sleep study- They will call when insurance authorizes    Follow-Up: At [facility], you and your health needs are our priority.  As part of our continuing mission to provide you with exceptional heart care, we have created designated Provider Care Teams.  These Care Teams include your primary Cardiologist (physician) and Advanced Practice Providers (APPs -  Physician Assistants and Nurse Practitioners) who all work together to provide you with the care you need, when you need it.  We recommend signing up for the patient portal called MyChart.  Sign up information is provided on this After Visit Summary.  MyChart is used to connect with patients for Virtual Visits (Telemedicine).  Patients are able to view lab/test results, encounter notes, upcoming appointments, etc.  Non-urgent messages can be sent to your provider as well.   To learn more about what you can do with MyChart, go to [email or web address].    Your next appointment:   4 month(s)  The format for your next appointment:   In Person  Provider:   Lamar Fitch, MD    Other Instructions NA

## 2024-06-08 NOTE — Telephone Encounter (Signed)
 Patient agreement reviewed and signed on 06/08/2024.  WatchPAT issued to patient on 06/08/2024 by Olam JONETTA Lesches, RN. Patient aware to not open the WatchPAT box until contacted with the activation PIN. Patient profile initialized in CloudPAT on 06/08/2024 by Olam Lesches, RN. Device serial number: [id number]  Please list Reason for Call as Advice Only and type WatchPAT issued to patient in the comment box.
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
## 2024-06-09 ENCOUNTER — Telehealth: Payer: Self-pay

## 2024-06-09 NOTE — Telephone Encounter (Signed)
**Note De-Identified Anne Lawrence Obfuscation** See phone note ([facility]-HST PA) in chart from 06/09/24 for details.

## 2024-06-09 NOTE — Telephone Encounter (Signed)
**Note De-Identified Anne Lawrence Obfuscation** Ordering provider: Dr Bernie Associated diagnoses: Somnolence-R40.0 and Snoring-R06.83  WatchPAT PA obtained on 06/09/2024 by [name], [name] HERO, LPN. Authorization: Per the [employer] Pre-Treatment Review CPT Search Tool: Code Description                        Recommendation       Confirmation #  [id number] SLP STDY UNATTENDED Review Not Recommended   [phone] Record the confirmation number for your records  Patient notified of PIN ([id number]) on 06/09/2024 [name] Notification Method: MyChart message. I called the pt but got no answer so I left a message on her VM (Ok per [facility]) advising her of the WatchPAT One-HST Pin # and that I have sent her a [employer] with the Pin # as well.  Phone note routed to covering staff for follow-up.

## 2024-06-10 ENCOUNTER — Ambulatory Visit (HOSPITAL_COMMUNITY): Payer: Self-pay

## 2024-06-10 MED ORDER — CEPHALEXIN 500 MG PO CAPS: 500.0000 mg | ORAL_CAPSULE | Freq: Two times a day (BID) | ORAL | 0 refills | Status: AC

## 2024-08-27 ENCOUNTER — Other Ambulatory Visit: Payer: Self-pay | Admitting: Gastroenterology

## 2024-09-02 ENCOUNTER — Ambulatory Visit
Admission: RE | Admit: 2024-09-02 | Discharge: 2024-09-02 | Disposition: A | Discharge status: Home / Self Care | Source: Ambulatory Visit | Attending: Oncology | Admitting: Oncology

## 2024-09-02 DIAGNOSIS — Z853 Personal history of malignant neoplasm of breast: Secondary | ICD-10-CM

## 2024-09-02 DIAGNOSIS — N6310 Unspecified lump in the right breast, unspecified quadrant: Secondary | ICD-10-CM

## 2024-09-02 DIAGNOSIS — N6489 Other specified disorders of breast: Secondary | ICD-10-CM
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
## 2024-09-09 ENCOUNTER — Inpatient Hospital Stay: Attending: Oncology

## 2024-09-09 ENCOUNTER — Other Ambulatory Visit: Payer: Self-pay | Admitting: Oncology

## 2024-09-09 ENCOUNTER — Inpatient Hospital Stay: Admitting: Oncology

## 2024-09-09 ENCOUNTER — Telehealth: Payer: Self-pay | Admitting: Oncology

## 2024-09-09 ENCOUNTER — Encounter: Payer: Self-pay | Admitting: Oncology

## 2024-09-09 VITALS — BP 127/76 | HR 66 | Temp 97.9°F | Resp 18 | Ht 61.0 in | Wt 259.6 lb

## 2024-09-09 DIAGNOSIS — D519 Vitamin B12 deficiency anemia, unspecified: Secondary | ICD-10-CM | POA: Diagnosis present

## 2024-09-09 DIAGNOSIS — C50812 Malignant neoplasm of overlapping sites of left female breast: Secondary | ICD-10-CM

## 2024-09-09 DIAGNOSIS — M8580 Other specified disorders of bone density and structure, unspecified site: Secondary | ICD-10-CM

## 2024-09-09 DIAGNOSIS — Z780 Asymptomatic menopausal state: Secondary | ICD-10-CM

## 2024-09-09 DIAGNOSIS — E559 Vitamin D deficiency, unspecified: Secondary | ICD-10-CM | POA: Diagnosis not present

## 2024-09-09 DIAGNOSIS — Z170 Estrogen receptor positive status [ER+]: Secondary | ICD-10-CM | POA: Diagnosis not present

## 2024-09-09 DIAGNOSIS — R928 Other abnormal and inconclusive findings on diagnostic imaging of breast: Secondary | ICD-10-CM

## 2024-09-09 LAB — CBC WITH DIFFERENTIAL (CANCER CENTER ONLY)
Abs Immature Granulocytes: 0.03 K/uL (ref 0.00–0.07)
Basophils Absolute: 0 K/uL (ref 0.0–0.1)
Basophils Relative: 0 %
Eosinophils Absolute: 0.2 K/uL (ref 0.0–0.5)
Eosinophils Relative: 2 %
HCT: 39.8 % (ref 36.0–46.0)
Hemoglobin: 12.7 g/dL (ref 12.0–15.0)
Immature Granulocytes: 0 %
Lymphocytes Relative: 27 %
Lymphs Abs: 2.5 K/uL (ref 0.7–4.0)
MCH: 25.6 pg — ABNORMAL LOW (ref 26.0–34.0)
MCHC: 31.9 g/dL (ref 30.0–36.0)
MCV: 80.1 fL (ref 80.0–100.0)
Monocytes Absolute: 0.5 K/uL (ref 0.1–1.0)
Monocytes Relative: 5 %
Neutro Abs: 6.1 K/uL (ref 1.7–7.7)
Neutrophils Relative %: 66 %
Platelet Count: 242 K/uL (ref 150–400)
RBC: 4.97 MIL/uL (ref 3.87–5.11)
RDW: 13.7 % (ref 11.5–15.5)
WBC Count: 9.3 K/uL (ref 4.0–10.5)
nRBC: 0 % (ref 0.0–0.2)

## 2024-09-09 LAB — CMP (CANCER CENTER ONLY)
ALT: 38 U/L (ref 0–44)
AST: 30 U/L (ref 15–41)
Albumin: 4.2 g/dL (ref 3.5–5.0)
Alkaline Phosphatase: 100 U/L (ref 38–126)
Anion gap: 12 (ref 5–15)
BUN: 12 mg/dL (ref 6–20)
CO2: 26 mmol/L (ref 22–32)
Calcium: 9.4 mg/dL (ref 8.9–10.3)
Chloride: 102 mmol/L (ref 98–111)
Creatinine: 0.6 mg/dL (ref 0.44–1.00)
GFR, Estimated: >60 mL/min (ref >60)
Glucose, Bld: 169 mg/dL — ABNORMAL HIGH (ref 70–99)
Potassium: 3.9 mmol/L (ref 3.5–5.1)
Sodium: 140 mmol/L (ref 135–145)
Total Bilirubin: 0.4 mg/dL (ref 0.0–1.2)
Total Protein: 7.6 g/dL (ref 6.5–8.1)

## 2024-09-09 LAB — VITAMIN D 25 HYDROXY (VIT D DEFICIENCY, FRACTURES): Vit D, 25-Hydroxy: 26.73 ng/mL — ABNORMAL LOW (ref 30–100)

## 2024-09-09 LAB — IRON AND TIBC
Iron: 41 ug/dL (ref 28–170)
Saturation Ratios: 10 % — ABNORMAL LOW (ref 10.4–31.8)
TIBC: 416 ug/dL (ref 250–450)
UIBC: 375 ug/dL

## 2024-09-09 LAB — VITAMIN B12: Vitamin B-12: 405 pg/mL (ref 180–914)

## 2024-09-09 LAB — FERRITIN: Ferritin: 88 ng/mL (ref 11–307)

## 2024-09-09 LAB — FOLATE: Folate: 13.7 ng/mL (ref >5.9)

## 2024-09-09 NOTE — Telephone Encounter (Signed)
 Patient has been scheduled for follow-up visit per 09/09/24 LOS.  Pt given an appt calendar with date and time.

## 2024-09-20 ENCOUNTER — Other Ambulatory Visit: Payer: Self-pay | Admitting: Gastroenterology

## 2024-09-25 ENCOUNTER — Other Ambulatory Visit: Payer: Self-pay | Admitting: Oncology

## 2024-09-25 DIAGNOSIS — E559 Vitamin D deficiency, unspecified: Secondary | ICD-10-CM

## 2024-09-25 DIAGNOSIS — D509 Iron deficiency anemia, unspecified: Secondary | ICD-10-CM | POA: Insufficient documentation

## 2024-09-25 HISTORY — DX: Iron deficiency anemia, unspecified: D50.9

## 2024-09-25 MED ORDER — ERGOCALCIFEROL 1.25 MG (50000 UT) PO CAPS: 50000.0000 [IU] | ORAL_CAPSULE | ORAL | 0 refills | Status: AC

## 2024-10-13 DIAGNOSIS — U071 COVID-19: Secondary | ICD-10-CM | POA: Insufficient documentation

## 2024-10-13 DIAGNOSIS — M8580 Other specified disorders of bone density and structure, unspecified site: Secondary | ICD-10-CM | POA: Insufficient documentation

## 2024-10-17 ENCOUNTER — Ambulatory Visit: Admitting: Cardiology

## 2024-12-08 ENCOUNTER — Other Ambulatory Visit: Payer: Managed Care, Other (non HMO)

## 2024-12-08 ENCOUNTER — Ambulatory Visit: Payer: Managed Care, Other (non HMO) | Admitting: Oncology

## 2025-06-30 ENCOUNTER — Inpatient Hospital Stay

## 2025-06-30 ENCOUNTER — Inpatient Hospital Stay: Admitting: Oncology
